# Patient Record
Sex: Female | Born: 1995 | Race: White | Hispanic: No | Marital: Single | Smoking: Never smoker
Health system: Southern US, Community
[De-identification: ages and names within clinical notes are randomized; demographics above are authoritative.]

## PROBLEM LIST (undated history)

## (undated) DIAGNOSIS — Z7689 Persons encountering health services in other specified circumstances: Secondary | ICD-10-CM

## (undated) DIAGNOSIS — E669 Obesity, unspecified: Secondary | ICD-10-CM

## (undated) DIAGNOSIS — N920 Excessive and frequent menstruation with regular cycle: Secondary | ICD-10-CM

## (undated) DIAGNOSIS — R87629 Unspecified abnormal cytological findings in specimens from vagina: Secondary | ICD-10-CM

## (undated) DIAGNOSIS — N946 Dysmenorrhea, unspecified: Secondary | ICD-10-CM

## (undated) DIAGNOSIS — F419 Anxiety disorder, unspecified: Secondary | ICD-10-CM

## (undated) DIAGNOSIS — I10 Essential (primary) hypertension: Secondary | ICD-10-CM

## (undated) HISTORY — DX: Persons encountering health services in other specified circumstances: Z76.89

## (undated) HISTORY — DX: Excessive and frequent menstruation with regular cycle: N92.0

## (undated) HISTORY — DX: Dysmenorrhea, unspecified: N94.6

## (undated) HISTORY — DX: Unspecified abnormal cytological findings in specimens from vagina: R87.629

## (undated) HISTORY — DX: Essential (primary) hypertension: I10

## (undated) HISTORY — DX: Obesity, unspecified: E66.9

## (undated) HISTORY — PX: WISDOM TOOTH EXTRACTION: SHX21

## (undated) HISTORY — DX: Anxiety disorder, unspecified: F41.9

---
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
## 2014-03-07 ENCOUNTER — Ambulatory Visit: Payer: BC Managed Care – PPO | Admitting: Adult Health

## 2014-03-07 ENCOUNTER — Encounter: Payer: Self-pay | Admitting: *Deleted

## 2015-04-06 ENCOUNTER — Ambulatory Visit (INDEPENDENT_AMBULATORY_CARE_PROVIDER_SITE_OTHER): Payer: BLUE CROSS/BLUE SHIELD | Admitting: Adult Health

## 2015-04-06 ENCOUNTER — Encounter: Payer: Self-pay | Admitting: Adult Health

## 2015-04-06 VITALS — BP 140/80 | HR 72 | Ht 64.0 in | Wt 275.6 lb

## 2015-04-06 DIAGNOSIS — N921 Excessive and frequent menstruation with irregular cycle: Secondary | ICD-10-CM | POA: Diagnosis not present

## 2015-04-06 DIAGNOSIS — N946 Dysmenorrhea, unspecified: Secondary | ICD-10-CM | POA: Diagnosis not present

## 2015-04-06 DIAGNOSIS — Z3202 Encounter for pregnancy test, result negative: Secondary | ICD-10-CM | POA: Diagnosis not present

## 2015-04-06 MED ORDER — NORETHIN-ETH ESTRAD-FE BIPHAS 1 MG-10 MCG / 10 MCG PO TABS: 1.0000 | ORAL_TABLET | Freq: Every day | ORAL | Status: DC

## 2015-04-06 NOTE — Patient Instructions (Signed)
Start lo loestrin today take 1 daily Follow up in 3 months

## 2015-04-06 NOTE — Progress Notes (Signed)
Subjective:     Patient ID: Melanie Solis, female   DOB: 1996/08/07, 19 y.o.   MRN: [id number]  HPI [name] is a 19 year old white female in complaining of missing 2 periods and before that periods heavy with cramps, was seen February 2015 and placed on Junel 1-20 and stopped due to nausea and vomiting and diarrhea, told her probably not the pill.  Review of Systems  Patient denies any headaches, hearing loss, fatigue, blurred vision, shortness of breath, chest pain, abdominal pain, problems with bowel movements, urination, or intercourse(never had sex). No joint pain or mood swings.has missed 2 periods, has heavy periods and cramps   Reviewed past medical,surgical, social and family history. Reviewed medications and allergies.     Objective:   Physical Exam BP 140/80 mmHg  Pulse 72  Ht 5\' 4"  (1.626 m)  Wt 275 lb 9.6 oz (125.011 kg)  BMI 47.28 kg/m2  LMP 01/10/2015  UPT negative, Skin warm and dry. Neck: mid line trachea, normal thyroid, good ROM, no lymphadenopathy noted. Lungs: clear to ausculation bilaterally. Cardiovascular: regular rate and rhythm.Discussed trying OCs again, take with food or at night and take every day, if has problems call, not stop taking.    Assessment:    Missed period Menorrhagia with irregular cycle Dysmenorrhea     Plan:     Rx lo loestrin disp 1 pack take 1 daily with 11 refills, start today Follow up in 3 months If has sex use condoms

## 2015-04-10 LAB — POCT URINE PREGNANCY: Preg Test, Ur: NEGATIVE

## 2015-07-11 ENCOUNTER — Encounter: Payer: Self-pay | Admitting: Adult Health

## 2015-07-11 ENCOUNTER — Ambulatory Visit (INDEPENDENT_AMBULATORY_CARE_PROVIDER_SITE_OTHER): Payer: BLUE CROSS/BLUE SHIELD | Admitting: Adult Health

## 2015-07-11 VITALS — BP 128/70 | HR 72 | Ht 65.0 in | Wt 271.0 lb

## 2015-07-11 DIAGNOSIS — Z308 Encounter for other contraceptive management: Secondary | ICD-10-CM | POA: Diagnosis not present

## 2015-07-11 DIAGNOSIS — Z7689 Persons encountering health services in other specified circumstances: Secondary | ICD-10-CM

## 2015-07-11 MED ORDER — NORETHIN-ETH ESTRAD-FE BIPHAS 1 MG-10 MCG / 10 MCG PO TABS: 1.0000 | ORAL_TABLET | Freq: Every day | ORAL | Status: DC

## 2015-07-11 NOTE — Progress Notes (Signed)
Subjective:     Patient ID: Melanie Solis, female   DOB: 13-Jan-1996, 19 y.o.   MRN: [id number]  HPI Melanie Solis is a 19 year old white female back for 3 month follow up of starting lo loestrin for menorrhagia and dysmenorrhea and is doing great, no more cramps and periods are about 2 days and lighter.  Review of Systems Patient denies any headaches, hearing loss, fatigue, blurred vision, shortness of breath, chest pain, abdominal pain, problems with bowel movements, urination, or intercourse(not having sex). No joint pain or mood swings.See HPI. Reviewed past medical,surgical, social and family history. Reviewed medications and allergies.     Objective:   Physical Exam BP 128/70 mmHg  Pulse 72  Ht  (1.651 m)  Wt 271 lb (122.925 kg)  BMI 45.10 kg/m2  LMP 06/02/2015 Skin warm and dry. Lungs: clear to ausculation bilaterally. Cardiovascular: regular rate and rhythm.Has lost about 5 lbs.    Assessment:     Period management    Plan:     Refilled lo loestrin x 1 year Return in 1 year for pap and physical,or before if needed

## 2015-07-11 NOTE — Patient Instructions (Signed)
Pap and physical in 1 year  

## 2015-12-14 ENCOUNTER — Ambulatory Visit (INDEPENDENT_AMBULATORY_CARE_PROVIDER_SITE_OTHER): Payer: BLUE CROSS/BLUE SHIELD | Admitting: Adult Health

## 2015-12-14 ENCOUNTER — Encounter: Payer: Self-pay | Admitting: Adult Health

## 2015-12-14 VITALS — BP 118/62 | HR 62 | Ht 65.0 in | Wt 267.0 lb

## 2015-12-14 DIAGNOSIS — Z7689 Persons encountering health services in other specified circumstances: Secondary | ICD-10-CM

## 2015-12-14 DIAGNOSIS — Z308 Encounter for other contraceptive management: Secondary | ICD-10-CM

## 2015-12-14 MED ORDER — NORETHIN-ETH ESTRAD-FE BIPHAS 1 MG-10 MCG / 10 MCG PO TABS: 1.0000 | ORAL_TABLET | Freq: Every day | ORAL | Status: DC

## 2015-12-14 NOTE — Patient Instructions (Signed)
Continue lo loestrin  go to DSS to check on family planning medicaid  Return in 4 months for pap and physical

## 2015-12-14 NOTE — Progress Notes (Signed)
Subjective:     Patient ID: Melanie Solis, female   DOB: 1996-03-03, 19 y.o.   MRN: [id number]  HPI Antoinetta is a 20 year old white female in to refill lo loesrtin, she is very happy with them , periods are light,short and regular and no cramps like before, Her Dad loses his job in June and she wants pap before then.  Review of Systems Patient denies any headaches, hearing loss, fatigue, blurred vision, shortness of breath, chest pain, abdominal pain, problems with bowel movements, urination, or intercourse(not having sex). No joint pain or mood swings. Reviewed past medical,surgical, social and family history. Reviewed medications and allergies.     Objective:   Physical Exam BP 118/62 mmHg  Pulse 62  Ht  (1.651 m)  Wt 267 lb (121.11 kg)  BMI 44.43 kg/m2  LMP 12/13/2015 Skin warm and dry. Neck: mid line trachea, normal thyroid, good ROM, no lymphadenopathy noted. Lungs: clear to ausculation bilaterally. Cardiovascular: regular rate and rhythm.She is very happy with lo loestrin and wants to continue.  Discussed could try to get family planning medicaid, which covers, pap and physical and birth control and she could then wait til 20 to get pap. Face time 10 minutes with 50 % counseling.     Assessment:     Period management     Plan:     Refilled lo loestrin take 1 daily disp 1 pack with 11 refills Return in 4 months for pap and physical  Go to DSS to se if can get family planning medicaid

## 2016-04-18 ENCOUNTER — Encounter: Payer: Self-pay | Admitting: Adult Health

## 2016-04-18 ENCOUNTER — Other Ambulatory Visit (HOSPITAL_COMMUNITY)
Admission: RE | Admit: 2016-04-18 | Discharge: 2016-04-18 | Disposition: A | Discharge status: Home / Self Care | Payer: BLUE CROSS/BLUE SHIELD | Source: Ambulatory Visit | Attending: Obstetrics & Gynecology | Admitting: Obstetrics & Gynecology

## 2016-04-18 ENCOUNTER — Ambulatory Visit (INDEPENDENT_AMBULATORY_CARE_PROVIDER_SITE_OTHER): Payer: BLUE CROSS/BLUE SHIELD | Admitting: Adult Health

## 2016-04-18 ENCOUNTER — Telehealth: Payer: Self-pay | Admitting: Adult Health

## 2016-04-18 VITALS — BP 132/76 | HR 72 | Ht 64.0 in | Wt 274.0 lb

## 2016-04-18 DIAGNOSIS — Z01419 Encounter for gynecological examination (general) (routine) without abnormal findings: Secondary | ICD-10-CM

## 2016-04-18 NOTE — Telephone Encounter (Signed)
Left message that insurance covers nexplanon 100% per Amy, so call for appt next week

## 2016-04-18 NOTE — Patient Instructions (Signed)
Physical in 1 year, pap in 3 inf normal Continue lo loestrin Will call about nexplanon coverage

## 2016-04-18 NOTE — Progress Notes (Signed)
Patient ID: Melanie CanningChrissy M Solis, female   DOB: 10/12/1996, 20 y.o.   MRN: [id number] History of Present Illness: Melanie DockerChrissy is a 20 year old white female in for a well woman gyn exam and pap.She loses her insurance 6/30.She is happy with lo loestrin, has no periods,but is interested in nexplanon. PCP is Dr Phillips OdorGolding.   Current Medications, Allergies, Past Medical History, Past Surgical History, Family History and Social History were reviewed in [facility] electronic medical record.     Review of Systems: Patient denies any headaches, hearing loss, fatigue, blurred vision, shortness of breath, chest pain, abdominal pain, problems with bowel movements, urination, or intercourse(not having sex). No joint pain or mood swings.    Physical Exam:BP 132/76 mmHg  Pulse 72  Ht 5\' 4"  (1.626 m)  Wt 274 lb (124.286 kg)  BMI 47.01 kg/m2 General:  Well developed, well nourished, no acute distress Skin:  Warm and dry Neck:  Midline trachea, normal thyroid, good ROM, no lymphadenopathy Lungs; Clear to auscultation bilaterally Breast:  No dominant palpable mass, retraction, or nipple discharge Cardiovascular: Regular rate and rhythm Abdomen:  Soft, non tender, no hepatosplenomegaly Pelvic:  External genitalia is normal in appearance, no lesions.  The vagina is normal in appearance. Urethra has no lesions or masses. The cervix is nulliparous, pap performed.Marland Kitchen.  Uterus is felt to be normal size, shape, and contour.  No adnexal masses or tenderness noted.Bladder is non tender, no masses felt. Extremities/musculoskeletal:  No swelling or varicosities noted, no clubbing or cyanosis Psych:  No mood changes, alert and cooperative,seems happy   Impression: Well woman gyn exam and pap   Plan: Physical in 1 year, pap in 3 if normal Continue lo loestrin for now, will check insurance for nexplanon coverage

## 2016-04-22 LAB — CYTOLOGY - PAP

## 2016-04-23 ENCOUNTER — Encounter: Payer: BLUE CROSS/BLUE SHIELD | Admitting: Adult Health

## 2016-04-23 ENCOUNTER — Other Ambulatory Visit: Payer: BLUE CROSS/BLUE SHIELD

## 2016-04-23 DIAGNOSIS — Z3049 Encounter for surveillance of other contraceptives: Secondary | ICD-10-CM

## 2016-04-23 LAB — BETA HCG QUANT (REF LAB): hCG Quant: <1 m[IU]/mL

## 2016-05-02 ENCOUNTER — Encounter: Payer: BLUE CROSS/BLUE SHIELD | Admitting: Adult Health

## 2016-05-02 ENCOUNTER — Other Ambulatory Visit: Payer: BLUE CROSS/BLUE SHIELD

## 2016-05-02 ENCOUNTER — Encounter: Payer: Self-pay | Admitting: Adult Health

## 2016-11-07 ENCOUNTER — Other Ambulatory Visit: Payer: Self-pay | Admitting: Adult Health

## 2017-02-12 DIAGNOSIS — R05 Cough: Secondary | ICD-10-CM | POA: Diagnosis not present

## 2017-02-12 DIAGNOSIS — Z6841 Body Mass Index (BMI) 40.0 and over, adult: Secondary | ICD-10-CM | POA: Diagnosis not present

## 2017-02-12 DIAGNOSIS — Z1389 Encounter for screening for other disorder: Secondary | ICD-10-CM | POA: Diagnosis not present

## 2017-02-12 DIAGNOSIS — H6693 Otitis media, unspecified, bilateral: Secondary | ICD-10-CM | POA: Diagnosis not present

## 2017-02-12 DIAGNOSIS — J329 Chronic sinusitis, unspecified: Secondary | ICD-10-CM | POA: Diagnosis not present

## 2017-05-30 DIAGNOSIS — R109 Unspecified abdominal pain: Secondary | ICD-10-CM | POA: Diagnosis not present

## 2017-05-30 DIAGNOSIS — E748 Other specified disorders of carbohydrate metabolism: Secondary | ICD-10-CM | POA: Diagnosis not present

## 2017-05-30 DIAGNOSIS — Z6841 Body Mass Index (BMI) 40.0 and over, adult: Secondary | ICD-10-CM | POA: Diagnosis not present

## 2017-05-30 DIAGNOSIS — Z1389 Encounter for screening for other disorder: Secondary | ICD-10-CM | POA: Diagnosis not present

## 2017-07-24 DIAGNOSIS — Z23 Encounter for immunization: Secondary | ICD-10-CM | POA: Diagnosis not present

## 2019-10-05 ENCOUNTER — Telehealth: Payer: Self-pay | Admitting: Advanced Practice Midwife

## 2019-10-05 NOTE — Telephone Encounter (Signed)

## 2019-10-06 ENCOUNTER — Encounter: Payer: Self-pay | Admitting: Advanced Practice Midwife

## 2019-10-06 ENCOUNTER — Ambulatory Visit (INDEPENDENT_AMBULATORY_CARE_PROVIDER_SITE_OTHER): Payer: BC Managed Care – PPO | Admitting: Advanced Practice Midwife

## 2019-10-06 ENCOUNTER — Other Ambulatory Visit: Payer: Self-pay

## 2019-10-06 DIAGNOSIS — Z6841 Body Mass Index (BMI) 40.0 and over, adult: Secondary | ICD-10-CM | POA: Insufficient documentation

## 2019-10-06 DIAGNOSIS — Z3046 Encounter for surveillance of implantable subdermal contraceptive: Secondary | ICD-10-CM | POA: Diagnosis not present

## 2019-10-06 DIAGNOSIS — Z30011 Encounter for initial prescription of contraceptive pills: Secondary | ICD-10-CM

## 2019-10-06 MED ORDER — NORETHIN ACE-ETH ESTRAD-FE 1-20 MG-MCG PO TABS: 1.0000 | ORAL_TABLET | Freq: Every day | ORAL | 4 refills | Status: DC

## 2019-10-06 NOTE — Progress Notes (Signed)
   [facility] REMOVAL Patient name: LATIANA TOMEI MRN [id number]  Date of birth: 1996/04/20 Subjective Findings:   Mehreen TYNIKA LUDDY is a 23 y.o. G0P0000 Caucasian female being seen today for removal of a Nexplanon. Her Nexplanon was placed in Feb 2018 at [facility]. She desires removal because 1) weight gain despite exercise and eating well 2) mood swings 3) pain in L upper arm after lifting that persists a day or two later. Signed copy of informed consent in chart. She is not currently sexually active. Unable to urinate.  No LMP recorded. Patient has had an implant. Last pap 03/2016. Results were:  normal The planned method of family planning is OCP (estrogen/progesterone) Pertinent History Reviewed:   Reviewed past medical,surgical, social, obstetrical and family history.  Reviewed problem list, medications and allergies. Objective Findings & Procedure:    Vitals:   10/06/19 0913  BP: 136/84  Pulse: 78  Weight: (!) 308 lb (139.7 kg)  Height: 5\' 5"  (1.651 m)  Body mass index is 51.25 kg/m.  No results found for this or any previous visit (from the past 24 hour(s)).   Time out was performed.  Nexplanon site identified.  Area prepped in usual sterile fashon. One cc of 2% lidocaine was used to anesthetize the area at the distal end of the implant. A small stab incision was made right beside the implant on the distal portion.  The Nexplanon rod was grasped using hemostats and removed without difficulty.  There was less than 3 cc blood loss. There were no complications.  Steri-strips were applied over the small incision and a pressure bandage was applied.  The patient tolerated the procedure well. Assessment & Plan:   1) Nexplanon removal She was instructed to keep the area clean and dry, remove pressure bandage in 24 hours, and keep insertion site covered with the steri-strip for 3-5 days.   Follow-up PRN problems.  2) Rx for Junel 1/20 to start tomorrow; will have f/u appt in  3 mos with Anderson Malta  No orders of the defined types were placed in this encounter.   Follow-up: Return for 3 mos, Pap & Physical, contraception f/u.  Myrtis Ser CNM 10/06/2019 10:42 AM

## 2020-01-04 ENCOUNTER — Other Ambulatory Visit (HOSPITAL_COMMUNITY)
Admission: RE | Admit: 2020-01-04 | Discharge: 2020-01-04 | Disposition: A | Discharge status: Home / Self Care | Payer: BC Managed Care – PPO | Source: Ambulatory Visit | Attending: Adult Health | Admitting: Adult Health

## 2020-01-04 ENCOUNTER — Other Ambulatory Visit: Payer: Self-pay

## 2020-01-04 ENCOUNTER — Ambulatory Visit (INDEPENDENT_AMBULATORY_CARE_PROVIDER_SITE_OTHER): Payer: BC Managed Care – PPO | Admitting: Adult Health

## 2020-01-04 ENCOUNTER — Encounter: Payer: Self-pay | Admitting: Adult Health

## 2020-01-04 VITALS — BP 145/83 | HR 80 | Ht 65.0 in | Wt 305.0 lb

## 2020-01-04 DIAGNOSIS — Z113 Encounter for screening for infections with a predominantly sexual mode of transmission: Secondary | ICD-10-CM | POA: Diagnosis not present

## 2020-01-04 DIAGNOSIS — Z01419 Encounter for gynecological examination (general) (routine) without abnormal findings: Secondary | ICD-10-CM | POA: Diagnosis not present

## 2020-01-04 DIAGNOSIS — Z124 Encounter for screening for malignant neoplasm of cervix: Secondary | ICD-10-CM

## 2020-01-04 DIAGNOSIS — Z3041 Encounter for surveillance of contraceptive pills: Secondary | ICD-10-CM

## 2020-01-04 DIAGNOSIS — Z3009 Encounter for other general counseling and advice on contraception: Secondary | ICD-10-CM

## 2020-01-04 DIAGNOSIS — F419 Anxiety disorder, unspecified: Secondary | ICD-10-CM | POA: Insufficient documentation

## 2020-01-04 MED ORDER — NORETHIN ACE-ETH ESTRAD-FE 1-20 MG-MCG PO TABS: 1.0000 | ORAL_TABLET | Freq: Every day | ORAL | 12 refills | Status: DC

## 2020-01-04 MED ORDER — SERTRALINE HCL 50 MG PO TABS: 50.0000 mg | ORAL_TABLET | Freq: Every day | ORAL | 3 refills | Status: DC

## 2020-01-04 NOTE — Patient Instructions (Signed)

## 2020-01-04 NOTE — Progress Notes (Signed)
Patient ID: Melanie Solis, female   DOB: 1995-11-19, 24 y.o.   MRN: [id number] History of Present Illness: Melanie Solis is a 24 year old white female,single, G0P0, in for a well woman gyn exam and pap.She had nexplanon removed in December and is on junel 1/20, had period in January, none since. Having anxiety. PCP is Dr Phillips Odor.    Current Medications, Allergies, Past Medical History, Past Surgical History, Family History and Social History were reviewed in [facility] record.     Review of Systems: Patient denies any headaches, hearing loss, fatigue, blurred vision, shortness of breath, chest pain, abdominal pain, problems with bowel movements, urination, or intercourse(not currently active). No joint pain or mood swings. +anxiety, almost panic attack, had to pull over the other day when driving and take a few deep breaths   Physical Exam:BP (!) 145/83 (BP Location: Left Arm, Patient Position: Sitting, Cuff Size: Large)   Pulse 80   Ht 5\' 5"  (1.651 m)   Wt (!) 305 lb (138.3 kg)   BMI 50.75 kg/m has lost 3 lbs since having nexplanon removed General:  Well developed, well nourished, no acute distress Skin:  Warm and dry Neck:  Midline trachea, normal thyroid, good ROM, no lymphadenopathy Lungs; Clear to auscultation bilaterally Breast:  No dominant palpable mass, retraction, or nipple discharge Cardiovascular: Regular rate and rhythm Abdomen:  Soft, non tender, no hepatosplenomegaly Pelvic:  External genitalia is normal in appearance, no lesions.  The vagina is normal in appearance. Urethra has no lesions or masses. The cervix is nulliparous, pap, with GC/CHL and HPV reflex.  Uterus is felt to be normal size, shape, and contour.  No adnexal masses or tenderness noted.Bladder is non tender, no masses felt. Extremities/musculoskeletal:  No swelling or varicosities noted, no clubbing or cyanosis Psych:  No mood changes, alert and cooperative,seems happy Fall is low PHQ 2  score is 0 Co exam with NP student.  Impression and Plan: 1. Routine cervical smear Pap sent   2. Encounter for gynecological examination with Papanicolaou smear of cervix Pap sent Physical in 1 year Pap in 3 if normal   3. Family planning Check HIV and RPR  4. Screening examination for STD (sexually transmitted disease) Check HIV and RPR  5. Anxiety Discussed medication and she wants to try something, will Rx Zoloft, she is aware can take several weeks to see a difference, and let me know in about 8-12 weeks how she is doing and will refill Zoloft or change dose if needed  Review handout on managing anxiety Take time for self Take deep breaths  Meds ordered this encounter  Medications  . norethindrone-ethinyl estradiol (JUNEL FE 1/20) 1-20 MG-MCG tablet    Sig: Take 1 tablet by mouth daily.    Dispense:  1 Package    Refill:  12    Order Specific Question:   Supervising Provider    Answer:   2/20, LUTHER H [[id number]]  . sertraline (ZOLOFT) 50 MG tablet    Sig: Take 1 tablet (50 mg total) by mouth daily.    Dispense:  30 tablet    Refill:  3    Order Specific Question:   Supervising Provider    Answer:   Despina Hidden, LUTHER H [[id number]]    6. Encounter for surveillance of contraceptive pills Continue junel 1/20 refills sent

## 2020-01-05 LAB — CYTOLOGY - PAP
Chlamydia: NEGATIVE
Comment: NEGATIVE
Comment: NORMAL
Diagnosis: NEGATIVE
Neisseria Gonorrhea: NEGATIVE

## 2020-01-05 LAB — HIV ANTIBODY (ROUTINE TESTING W REFLEX): HIV Screen 4th Generation wRfx: NONREACTIVE

## 2020-01-05 LAB — RPR: RPR Ser Ql: NONREACTIVE

## 2020-02-22 ENCOUNTER — Other Ambulatory Visit: Payer: Self-pay | Admitting: Adult Health

## 2020-02-22 MED ORDER — BUSPIRONE HCL 5 MG PO TABS: 5.0000 mg | ORAL_TABLET | Freq: Three times a day (TID) | ORAL | 1 refills | Status: DC

## 2020-02-22 NOTE — Progress Notes (Signed)
Will add buspar

## 2020-04-13 ENCOUNTER — Other Ambulatory Visit: Payer: Self-pay | Admitting: Adult Health

## 2020-05-10 ENCOUNTER — Other Ambulatory Visit: Payer: Self-pay | Admitting: Adult Health

## 2020-05-10 MED ORDER — NORETHIN ACE-ETH ESTRAD-FE 1-20 MG-MCG PO TABS: 1.0000 | ORAL_TABLET | Freq: Every day | ORAL | 4 refills | Status: DC

## 2020-05-10 NOTE — Progress Notes (Signed)
Refilled junel  

## 2020-08-10 DIAGNOSIS — Z23 Encounter for immunization: Secondary | ICD-10-CM | POA: Diagnosis not present

## 2020-10-04 ENCOUNTER — Other Ambulatory Visit: Payer: Self-pay | Admitting: Adult Health

## 2020-10-04 MED ORDER — NORGESTIMATE-ETH ESTRADIOL 0.25-35 MG-MCG PO TABS: 1.0000 | ORAL_TABLET | Freq: Every day | ORAL | 11 refills | Status: DC

## 2020-10-04 NOTE — Progress Notes (Signed)
Will try sprintec to stop bleeding

## 2021-02-13 ENCOUNTER — Other Ambulatory Visit: Payer: Self-pay | Admitting: Adult Health

## 2021-03-21 ENCOUNTER — Other Ambulatory Visit: Payer: Self-pay | Admitting: Adult Health

## 2021-04-27 ENCOUNTER — Other Ambulatory Visit: Payer: Self-pay | Admitting: Adult Health

## 2021-11-16 ENCOUNTER — Other Ambulatory Visit: Payer: Self-pay | Admitting: Adult Health

## 2022-02-19 ENCOUNTER — Ambulatory Visit (INDEPENDENT_AMBULATORY_CARE_PROVIDER_SITE_OTHER): Payer: BC Managed Care – PPO

## 2022-02-19 ENCOUNTER — Encounter: Payer: Self-pay | Admitting: Emergency Medicine

## 2022-02-19 ENCOUNTER — Ambulatory Visit
Admission: EM | Admit: 2022-02-19 | Discharge: 2022-02-19 | Disposition: A | Discharge status: Home / Self Care | Payer: BC Managed Care – PPO

## 2022-02-19 DIAGNOSIS — S8001XA Contusion of right knee, initial encounter: Secondary | ICD-10-CM

## 2022-02-19 DIAGNOSIS — M25561 Pain in right knee: Secondary | ICD-10-CM

## 2022-02-19 MED ORDER — IBUPROFEN 800 MG PO TABS: 800.0000 mg | ORAL_TABLET | Freq: Three times a day (TID) | ORAL | 0 refills | Status: DC | PRN

## 2022-02-19 NOTE — ED Provider Notes (Addendum)
?[facility] ? ? ? ?CSN: [id number] ?Arrival date & time: 02/19/22  1302 ? ? ?  ? ?History   ?Chief Complaint ?Chief Complaint  ?Patient presents with  ? Knee Injury  ?  I fell and hit the same knee twice and it?s causing pain - Entered by patient  ? ? ?HPI ?Melanie Solis is a 26 y.o. female.  ? ?The patient is a 26 year old female who presents with right knee pain.  Patient states she fell on her knee at work at the [employer] after she slipped in dog urine and fell onto a concrete floor directly onto the right knee.  She states 1 day ago, she was going up the steps into her house and slipped on the steps and fell onto the knee again.  She states the pain has worsened significantly since she fell the second time.  He states that sitting in the flexed position or extended position and long periods of time causes shooting pains throughout her knee.  Patient has tried ice, heat, and has taken Tylenol for her symptoms.  She denies any prior injury to the knee. ? ?The history is provided by the patient.  ? ?Past Medical History:  ?Diagnosis Date  ? Dysmenorrhea 12/06/2013  ? Menorrhagia 12/06/2013  ? Menstrual extraction 12/06/2013  ? Will try OCs  ? Obesity   ? ? ?Patient Active Problem List  ? Diagnosis Date Noted  ? Anxiety 01/04/2020  ? Screening examination for STD (sexually transmitted disease) 01/04/2020  ? Family planning 01/04/2020  ? Routine cervical smear 01/04/2020  ? Encounter for surveillance of contraceptive pills 01/04/2020  ? Morbid obesity with BMI of 50.0-59.9, adult (HCC) 10/06/2019  ? OCP (oral contraceptive pills) initiation 10/06/2019  ? Menstrual extraction 12/06/2013  ? Menorrhagia 12/06/2013  ? Dysmenorrhea 12/06/2013  ? ? ?Past Surgical History:  ?Procedure Laterality Date  ? WISDOM TOOTH EXTRACTION    ? ? ?OB History   ? ? Gravida  ?0  ? Para  ?0  ? Term  ?0  ? Preterm  ?0  ? AB  ?0  ? Living  ?0  ?  ? ? SAB  ?0  ? IAB  ?0  ? Ectopic  ?0  ? Multiple  ?0  ? Live Births  ?   ?   ?  ?   ? ? ? ?Home Medications   ? ?Prior to Admission medications   ?Medication Sig Start Date End Date Taking? Authorizing Provider  ?escitalopram (LEXAPRO) 20 MG tablet Take 20 mg by mouth daily.   Yes [provider]  ?ibuprofen (ADVIL) 800 MG tablet Take 1 tablet (800 mg total) by mouth every 8 (eight) hours as needed. 02/19/22  Yes Amiir Heckard-Warren, Sadie Haberhristie J, NP  ?busPIRone (BUSPAR) 5 MG tablet TAKE (1) TABLET BY MOUTH (3) TIMES DAILY. 03/21/21   Adline PotterGriffin, Jennifer A, NP  ?sertraline (ZOLOFT) 50 MG tablet TAKE ONE TABLET BY MOUTH ONCE DAILY. 05/01/21   Adline PotterGriffin, Jennifer A, NP  ?SPRINTEC 28 0.25-35 MG-MCG tablet TAKE ONE TABLET BY MOUTH ONCE DAILY. 11/19/21   Adline PotterGriffin, Jennifer A, NP  ? ? ?Family History ?Family History  ?Problem Relation Age of Onset  ? Cancer Mother   ?     skin  ? ? ?Social History ?Social History  ? ?Tobacco Use  ? Smoking status: Never  ? Smokeless tobacco: Never  ?Vaping Use  ? Vaping Use: Never used  ?Substance Use Topics  ? Alcohol use: No  ?  Drug use: No  ? ? ? ?Allergies   ?Patient has no known allergies. ? ? ?Review of Systems ?Review of Systems  ?Constitutional: Negative.   ?Musculoskeletal:  Positive for gait problem and joint swelling (right knee).  ?     Right knee pain  ?Skin: Negative.   ?Psychiatric/Behavioral: Negative.    ? ? ?Physical Exam ?Triage Vital Signs ?ED Triage Vitals  ?Enc Vitals Group  ?   BP 02/19/22 1510 (!) 172/100  ?   Pulse Rate 02/19/22 1510 79  ?   Resp 02/19/22 1510 18  ?   Temp 02/19/22 1510 98 ?F (36.7 ?C)  ?   Temp Source 02/19/22 1510 Oral  ?   SpO2 02/19/22 1510 99 %  ?   Weight --   ?   Height --   ?   Head Circumference --   ?   Peak Flow --   ?   Pain Score 02/19/22 1511 8  ?   Pain Loc --   ?   Pain Edu? --   ?   Excl. in GC? --   ? ?No data found. ? ?Updated Vital Signs ?BP (!) 172/100 (BP Location: Right Arm)   Pulse 79   Temp 98 ?F (36.7 ?C) (Oral)   Resp 18   LMP 02/16/2022 (Exact Date)   SpO2 99%  ? ?Visual Acuity ?Right Eye Distance:   ?Left  Eye Distance:   ?Bilateral Distance:   ? ?Right Eye Near:   ?Left Eye Near:    ?Bilateral Near:    ? ?Physical Exam ?Vitals reviewed.  ?Constitutional:   ?   Appearance: Normal appearance.  ?Eyes:  ?   Extraocular Movements: Extraocular movements intact.  ?   Pupils: Pupils are equal, round, and reactive to light.  ?Pulmonary:  ?   Effort: Pulmonary effort is normal.  ?Musculoskeletal:  ?   Cervical back: Normal range of motion.  ?   Right knee: Decreased range of motion. Tenderness present over the medial joint line, lateral joint line, MCL, LCL and patellar tendon.  ?   Instability Tests: Anterior drawer test negative. Posterior drawer test negative.  ?   Left knee: Normal.  ?Skin: ?   General: Skin is warm and dry.  ?Neurological:  ?   Mental Status: She is alert.  ? ? ? ?UC Treatments / Results  ?Labs ?(all labs ordered are listed, but only abnormal results are displayed) ?Labs Reviewed - No data to display ? ?EKG ? ? ?Radiology ?DG Knee Complete 4 Views Right ? ?Result Date: 02/19/2022 ?CLINICAL DATA:  Status post fall onto concrete.  Pain to knee. EXAM: RIGHT KNEE - COMPLETE 4+ VIEW COMPARISON:  None. FINDINGS: No joint effusion. No acute fracture or dislocation. No significant arthropathy. Soft tissues are unremarkable. IMPRESSION: 1. No acute findings. Electronically Signed   By: Signa Kell M.D.   On: 02/19/2022 15:27   ? ?Procedures ?Procedures (including critical care time) ? ?Medications Ordered in UC ?Medications - No data to display ? ?Initial Impression / Assessment and Plan / UC Course  ?I have reviewed the triage vital signs and the nursing notes. ? ?Pertinent labs & imaging results that were available during my care of the patient were reviewed by me and considered in my medical decision making (see chart for details). ? ?The patient is a 26 year old female who presents with pain in the right knee after 2 falls.  Most recent fall was 1 day ago.  Patient has  tenderness to the lateral and medial  aspect of the right knee, and in the patella tendon.  Patient has some swelling to the area as well.  Symptoms are consistent with a knee contusion based on her mechanism of injury.  X-rays do not show any fractures or dislocations.  RICE therapy was recommended, rest, ice, compression, and elevation.  Patient advised to take ibuprofen or Tylenol for pain, fever, or general discomfort.  Patient advised that symptoms may take 1 to 2 weeks before she notices improvement.  Patient advised that if symptoms continue to persist after that time, to follow-up with her PCP or with orthopedics. ?Final Clinical Impressions(s) / UC Diagnoses  ? ?Final diagnoses:  ?None  ? ? ? ?Discharge Instructions   ? ?  ?Your x-rays are negative for fracture or dislocation.  As discussed, based on the mechanism of injury and your falls, symptoms are consistent with a knee contusion or bruise. ?RICE therapy to help with your symptoms.  Rest, ice, compression, and elevation.  Apply ice for 20 minutes, remove for 1 hour, then repeat.  Do this as much as possible within the next 48 to 72 hours. ?May take ibuprofen for knee pain.  I have provided prescription for you.  Take medication with food and water. ?Avoid strenuous activity while symptoms persist to include excessive bending, stooping, or squatting. ?Follow-up if your symptoms do not improve.  Symptoms may take 1 to 2 weeks before you notice real improvement however, if symptoms continue to worsen or do not improve, follow-up in our clinic or with orthopedics. ? ? ? ? ?ED Prescriptions   ? ? Medication Sig Dispense Auth. Provider  ? ibuprofen (ADVIL) 800 MG tablet Take 1 tablet (800 mg total) by mouth every 8 (eight) hours as needed. 20 tablet Taylen Wendland-Warren, Sadie Haber, NP  ? ?  ? ?PDMP not reviewed this encounter. ?  ?Abran Cantor, NP ?02/19/22 1544 ? ?  ?Abran Cantor, NP ?02/23/22 1155 ? ?

## 2022-02-19 NOTE — ED Triage Notes (Signed)
Fell on right knee on concrete 1 week ago.  Larey Seat again going up steps on right knee yesterday. ?

## 2022-02-19 NOTE — Discharge Instructions (Addendum)
Your x-rays are negative for fracture or dislocation.  As discussed, based on the mechanism of injury and your falls, symptoms are consistent with a knee contusion or bruise. ?RICE therapy to help with your symptoms.  Rest, ice, compression, and elevation.  Apply ice for 20 minutes, remove for 1 hour, then repeat.  Do this as much as possible within the next 48 to 72 hours. ?May take ibuprofen for knee pain.  I have provided prescription for you.  Take medication with food and water. ?Avoid strenuous activity while symptoms persist to include excessive bending, stooping, or squatting. ?Follow-up if your symptoms do not improve.  Symptoms may take 1 to 2 weeks before you notice real improvement however, if symptoms continue to worsen or do not improve, follow-up in our clinic or with orthopedics. ?

## 2022-08-06 ENCOUNTER — Encounter: Payer: Self-pay | Admitting: Emergency Medicine

## 2022-08-06 ENCOUNTER — Ambulatory Visit
Admission: EM | Admit: 2022-08-06 | Discharge: 2022-08-06 | Disposition: A | Discharge status: Home / Self Care | Payer: BC Managed Care – PPO | Attending: Nurse Practitioner | Admitting: Nurse Practitioner

## 2022-08-06 DIAGNOSIS — J019 Acute sinusitis, unspecified: Secondary | ICD-10-CM

## 2022-08-06 MED ORDER — AMOXICILLIN-POT CLAVULANATE 875-125 MG PO TABS: 1.0000 | ORAL_TABLET | Freq: Two times a day (BID) | ORAL | 0 refills | Status: DC

## 2022-08-06 MED ORDER — PROMETHAZINE-DM 6.25-15 MG/5ML PO SYRP: 5.0000 mL | ORAL_SOLUTION | Freq: Four times a day (QID) | ORAL | 0 refills | Status: DC | PRN

## 2022-08-06 MED ORDER — FLUTICASONE PROPIONATE 50 MCG/ACT NA SUSP: 2.0000 | Freq: Every day | NASAL | 0 refills | Status: DC

## 2022-08-06 NOTE — ED Provider Notes (Signed)
[facility]    CSN: [id number] Arrival date & time: 08/06/22  1301      History   Chief Complaint No chief complaint on file.   HPI Melanie Solis is a 26 y.o. female.   The history is provided by the patient.   Patient presents with a 3-week history of cough, nasal congestion, right ear fullness and pressure.  Patient denies fever, chills, sore throat, headache, wheezing, shortness of breath, difficulty breathing, or GI symptoms.  Patient states that symptoms started off as a "cold", but she states symptoms have progressively worsened since they started.  She states she has been taking over-the-counter Mucinex without relief.    Past Medical History:  Diagnosis Date   Dysmenorrhea 12/06/2013   Menorrhagia 12/06/2013   Menstrual extraction 12/06/2013   Will try OCs   Obesity     Patient Active Problem List   Diagnosis Date Noted   Anxiety 01/04/2020   Screening examination for STD (sexually transmitted disease) 01/04/2020   Family planning 01/04/2020   Routine cervical smear 01/04/2020   Encounter for surveillance of contraceptive pills 01/04/2020   Morbid obesity with BMI of 50.0-59.9, adult (HCC) 10/06/2019   OCP (oral contraceptive pills) initiation 10/06/2019   Menstrual extraction 12/06/2013   Menorrhagia 12/06/2013   Dysmenorrhea 12/06/2013    Past Surgical History:  Procedure Laterality Date   WISDOM TOOTH EXTRACTION      OB History     Gravida  0   Para  0   Term  0   Preterm  0   AB  0   Living  0      SAB  0   IAB  0   Ectopic  0   Multiple  0   Live Births               Home Medications    Prior to Admission medications   Medication Sig Start Date End Date Taking? Authorizing Provider  amoxicillin-clavulanate (AUGMENTIN) 875-125 MG tablet Take 1 tablet by mouth every 12 (twelve) hours. 08/06/22  Yes Rihanna Marseille-Warren, Sadie Haber, NP  fluticasone (FLONASE) 50 MCG/ACT nasal spray Place 2 sprays into both nostrils  daily. 08/06/22  Yes Brent Noto-Warren, Sadie Haber, NP  promethazine-dextromethorphan (PROMETHAZINE-DM) 6.25-15 MG/5ML syrup Take 5 mLs by mouth 4 (four) times daily as needed for cough. 08/06/22  Yes Melvine Julin-Warren, Sadie Haber, NP  busPIRone (BUSPAR) 5 MG tablet TAKE (1) TABLET BY MOUTH (3) TIMES DAILY. 03/21/21   Adline Potter, NP  escitalopram (LEXAPRO) 20 MG tablet Take 20 mg by mouth daily.    [provider]  ibuprofen (ADVIL) 800 MG tablet Take 1 tablet (800 mg total) by mouth every 8 (eight) hours as needed. 02/19/22   Janard Culp-Warren, Sadie Haber, NP  sertraline (ZOLOFT) 50 MG tablet TAKE ONE TABLET BY MOUTH ONCE DAILY. 05/01/21   Adline Potter, NP  SPRINTEC 28 0.25-35 MG-MCG tablet TAKE ONE TABLET BY MOUTH ONCE DAILY. 11/19/21   Adline Potter, NP    Family History Family History  Problem Relation Age of Onset   Cancer Mother        skin    Social History Social History   Tobacco Use   Smoking status: Never   Smokeless tobacco: Never  Vaping Use   Vaping Use: Never used  Substance Use Topics   Alcohol use: No   Drug use: No     Allergies   Patient has no known allergies.   Review of  Systems Review of Systems Per HPI  Physical Exam Triage Vital Signs ED Triage Vitals  Enc Vitals Group     BP 08/06/22 1307 (!) 154/95     Pulse Rate 08/06/22 1307 92     Resp 08/06/22 1307 16     Temp 08/06/22 1307 (!) 97.3 F (36.3 C)     Temp Source 08/06/22 1307 Oral     SpO2 08/06/22 1307 97 %     Weight --      Height --      Head Circumference --      Peak Flow --      Pain Score 08/06/22 1308 7     Pain Loc --      Pain Edu? --      Excl. in GC? --    No data found.  Updated Vital Signs BP (!) 154/95 (BP Location: Right Arm)   Pulse 92   Temp (!) 97.3 F (36.3 C) (Oral)   Resp 16   LMP 07/15/2022 (Exact Date)   SpO2 97%   Visual Acuity Right Eye Distance:   Left Eye Distance:   Bilateral Distance:    Right Eye Near:   Left Eye Near:     Bilateral Near:     Physical Exam Vitals and nursing note reviewed.  Constitutional:      Appearance: She is well-developed.  HENT:     Head: Normocephalic and atraumatic.     Right Ear: Ear canal and external ear normal.     Left Ear: Ear canal and external ear normal.     Nose: Congestion present. No rhinorrhea.     Right Turbinates: Enlarged and swollen.     Left Turbinates: Enlarged and swollen.     Right Sinus: No maxillary sinus tenderness or frontal sinus tenderness.     Left Sinus: No maxillary sinus tenderness or frontal sinus tenderness.     Mouth/Throat:     Lips: Pink.     Mouth: Mucous membranes are moist.     Pharynx: Oropharynx is clear. Uvula midline. Posterior oropharyngeal erythema present. No pharyngeal swelling, oropharyngeal exudate or uvula swelling.     Tonsils: No tonsillar exudate.  Eyes:     Conjunctiva/sclera: Conjunctivae normal.     Pupils: Pupils are equal, round, and reactive to light.  Neck:     Thyroid: No thyromegaly.     Trachea: No tracheal deviation.  Cardiovascular:     Rate and Rhythm: Normal rate and regular rhythm.     Pulses: Normal pulses.     Heart sounds: Normal heart sounds.  Pulmonary:     Effort: Pulmonary effort is normal.     Breath sounds: Normal breath sounds.  Abdominal:     General: Bowel sounds are normal. There is no distension.     Palpations: Abdomen is soft.     Tenderness: There is no abdominal tenderness.  Musculoskeletal:     Cervical back: Normal range of motion and neck supple.  Skin:    General: Skin is warm and dry.  Neurological:     Mental Status: She is alert and oriented to person, place, and time.  Psychiatric:        Behavior: Behavior normal.        Thought Content: Thought content normal.        Judgment: Judgment normal.      UC Treatments / Results  Labs (all labs ordered are listed, but only abnormal results are displayed) Labs Reviewed -  No data to display  EKG   Radiology No  results found.  Procedures Procedures (including critical care time)  Medications Ordered in UC Medications - No data to display  Initial Impression / Assessment and Plan / UC Course  I have reviewed the triage vital signs and the nursing notes.  Pertinent labs & imaging results that were available during my care of the patient were reviewed by me and considered in my medical decision making (see chart for details).  Patient presents with a 3-week history of upper respiratory symptoms.  On exam, patient's vital signs are stable, she is in no acute distress.  Lung sounds are clear throughout.  Based on the patient's presentation, and current symptoms, symptoms are consistent with acute sinusitis.  We will treat patient with Augmentin, fluticasone, and Promethazine DM for her cough.  Supportive care recommendations were also provided to the patient.  Discussed with patient that cough may continue to linger after she has completed her medication regimen.  Patient was advised of when to return if the cough continues to persist.  Patient verbalizes understanding.  All questions were answered. Final Clinical Impressions(s) / UC Diagnoses   Final diagnoses:  Acute sinusitis, recurrence not specified, unspecified location     Discharge Instructions      Take medication as directed. Increase fluids and get plenty of rest. May take over-the-counter ibuprofen or Tylenol as needed for pain, fever, or general discomfort. Recommend normal saline nasal spray to help with nasal congestion throughout the day. For your cough, it may be helpful to use a humidifier at bedtime during sleep. Follow-up in this clinic if symptoms fail to improve after this medication or with your primary care physician.     ED Prescriptions     Medication Sig Dispense Auth. Provider   amoxicillin-clavulanate (AUGMENTIN) 875-125 MG tablet Take 1 tablet by mouth every 12 (twelve) hours. 14 tablet Elvis Boot-Warren, Alda Lea,  NP   fluticasone (FLONASE) 50 MCG/ACT nasal spray Place 2 sprays into both nostrils daily. 16 g Xana Bradt-Warren, Alda Lea, NP   promethazine-dextromethorphan (PROMETHAZINE-DM) 6.25-15 MG/5ML syrup Take 5 mLs by mouth 4 (four) times daily as needed for cough. 140 mL Amiley Shishido-Warren, Alda Lea, NP      PDMP not reviewed this encounter.   Tish Men, NP 08/06/22 1347

## 2022-08-06 NOTE — ED Triage Notes (Signed)
Cough, right ear feel stopped up and hurts.  Productive cough with green sputum.  Symptoms x 3 weeks, but became worse wince Thursday.  Has been taking mucinex without relief.

## 2022-08-06 NOTE — Discharge Instructions (Signed)
Take medication as directed. Increase fluids and get plenty of rest. May take over-the-counter ibuprofen or Tylenol as needed for pain, fever, or general discomfort. Recommend normal saline nasal spray to help with nasal congestion throughout the day. For your cough, it may be helpful to use a humidifier at bedtime during sleep. Follow-up in this clinic if symptoms fail to improve after this medication or with your primary care physician.

## 2022-09-22 IMAGING — DX DG KNEE COMPLETE 4+V*R*
4 series · 4 of 4 positions shown · non-contrast
Comparison: None.

CLINICAL DATA: Status post fall onto concrete.  Pain to knee.

EXAM:
RIGHT KNEE - COMPLETE 4+ VIEW

[knee ap]
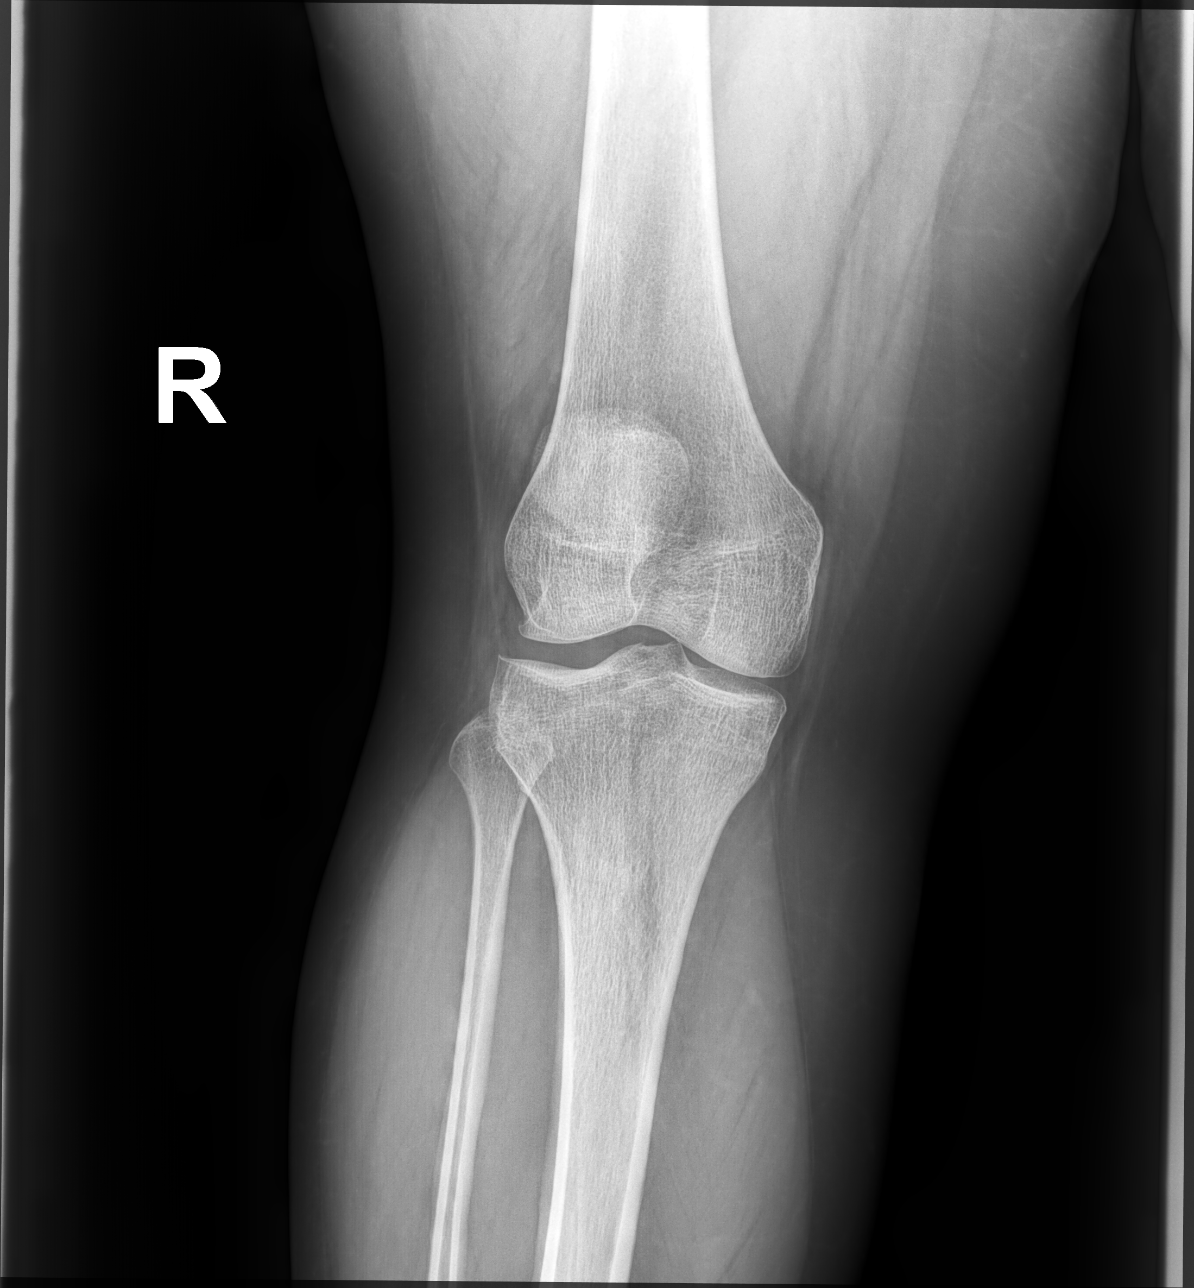

[knee mlo (1 of 2)]
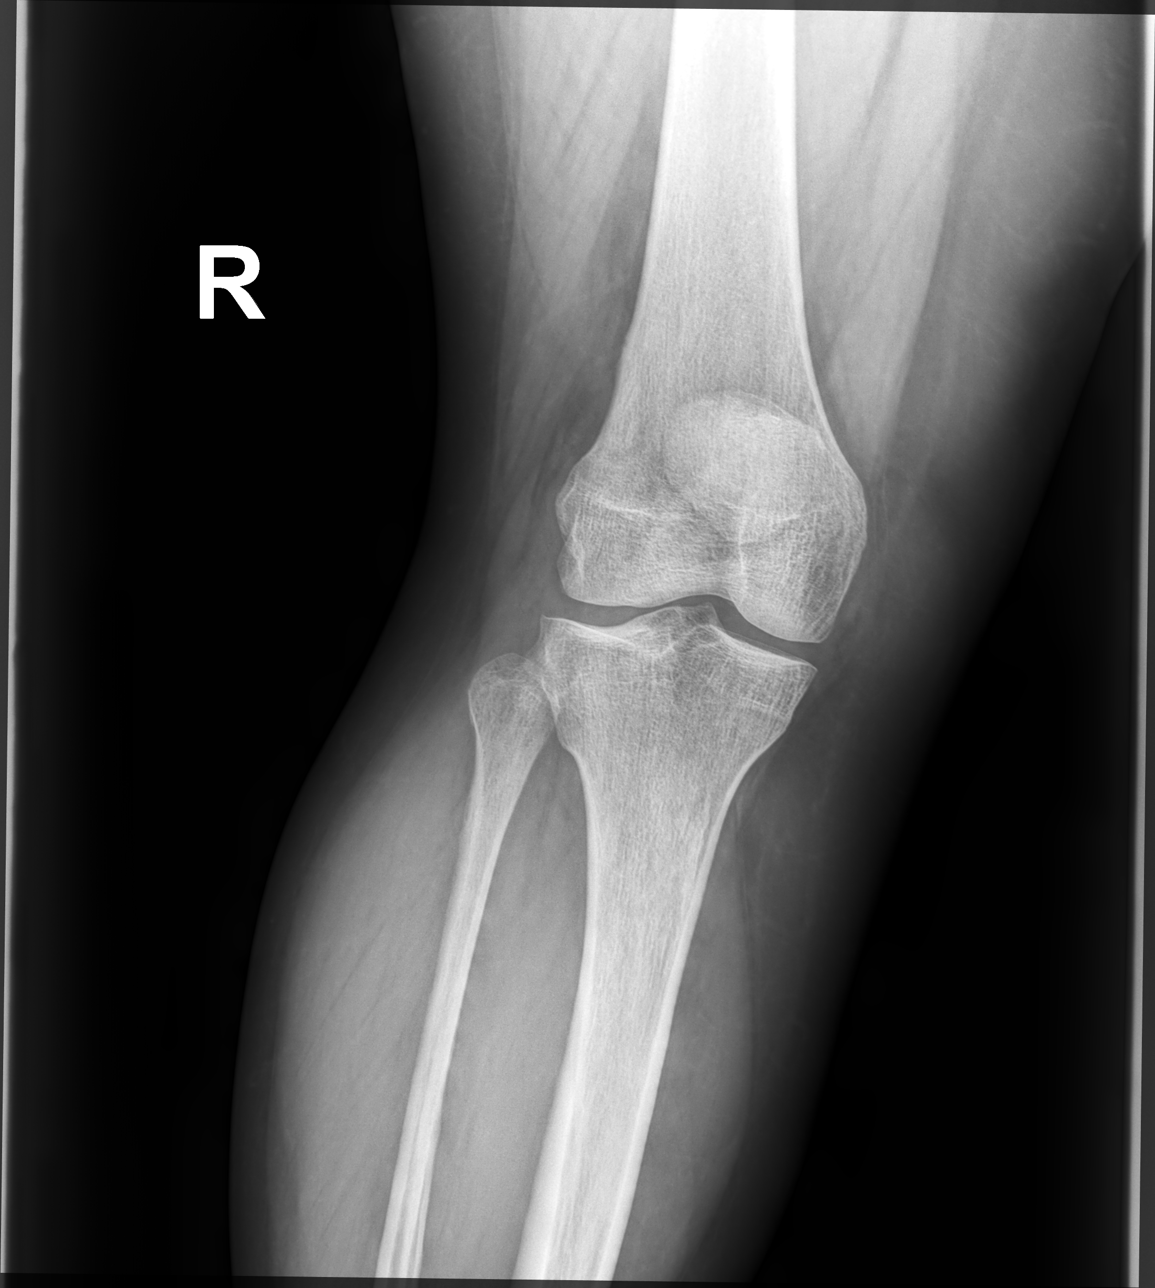

[knee mlo (2 of 2)]
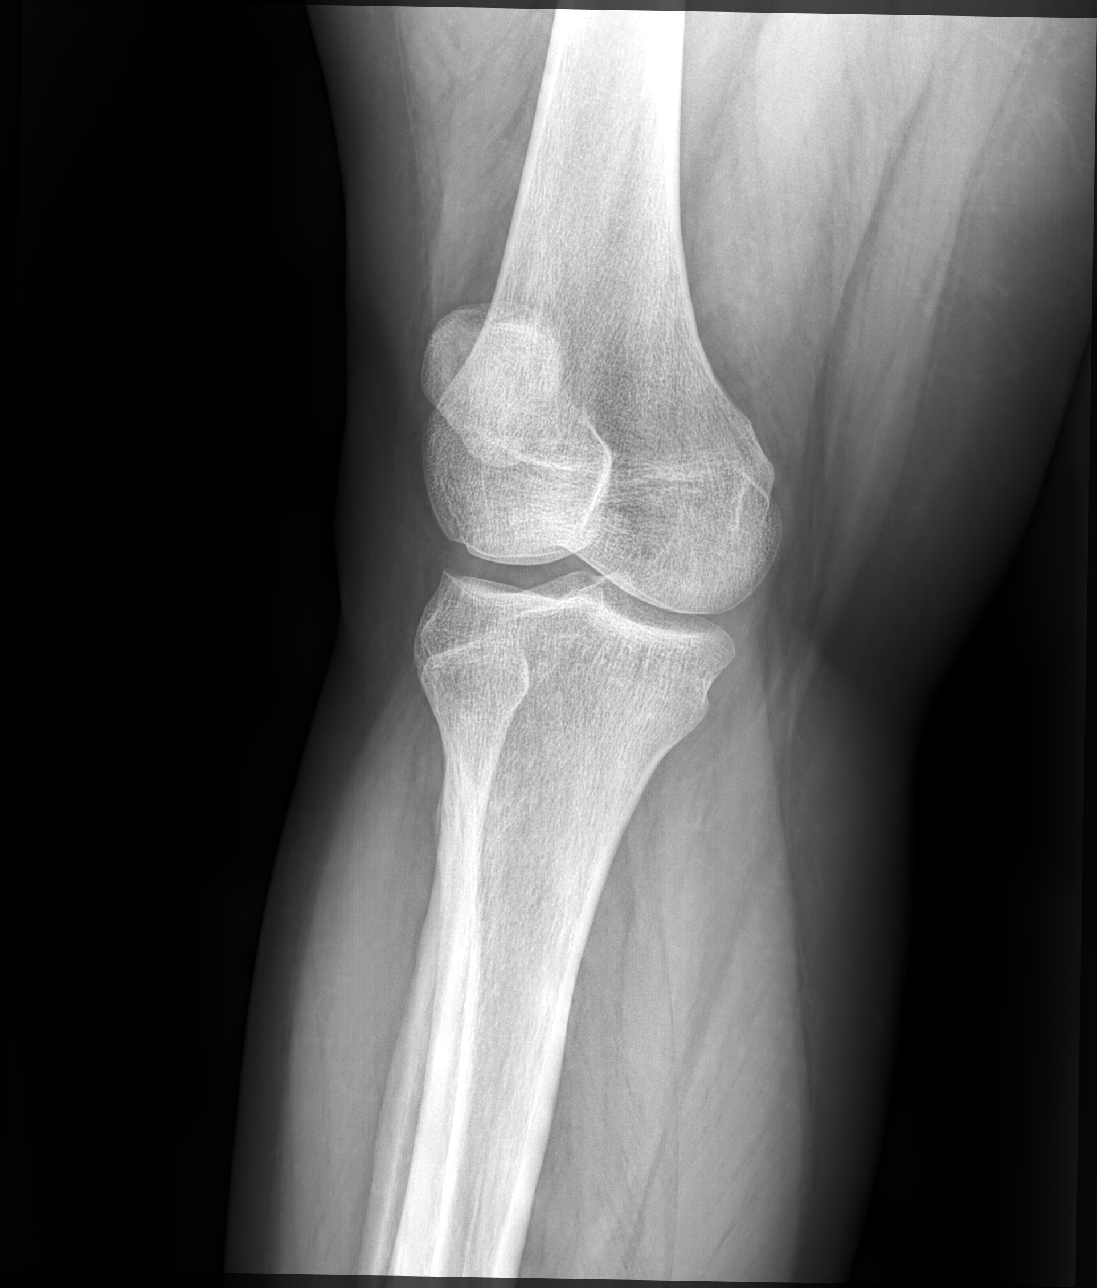

[knee lat]
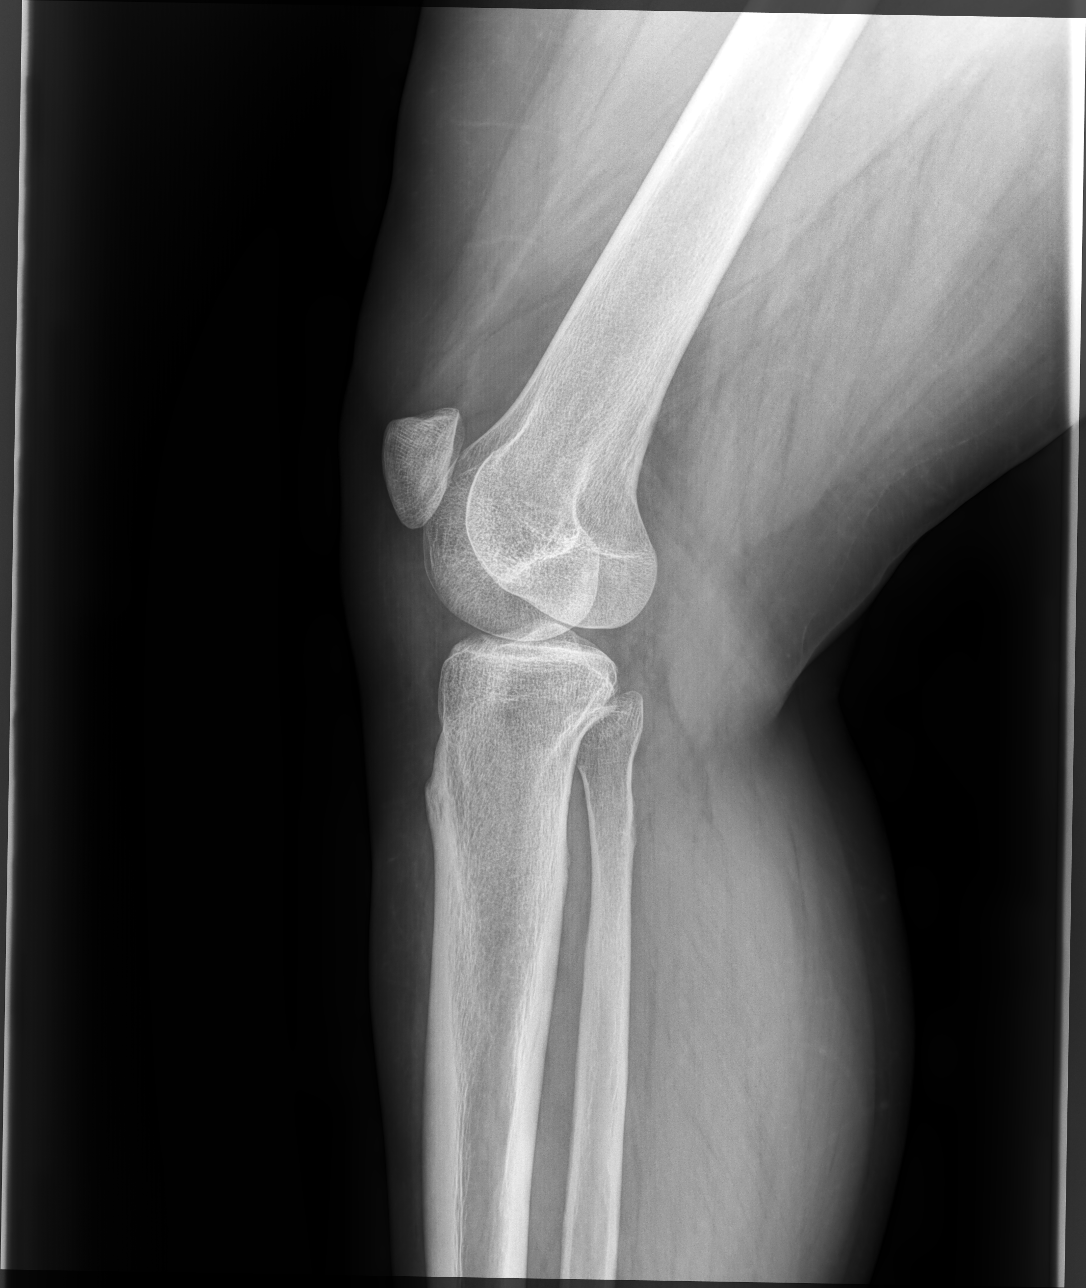

[4 of 4 positions shown; findings below may reference images not displayed]

FINDINGS: No joint effusion. No acute fracture or dislocation. No significant
arthropathy. Soft tissues are unremarkable.
IMPRESSION: 1. No acute findings.

## 2023-03-04 ENCOUNTER — Other Ambulatory Visit (HOSPITAL_COMMUNITY)
Admission: RE | Admit: 2023-03-04 | Discharge: 2023-03-04 | Disposition: A | Discharge status: Home / Self Care | Payer: BC Managed Care – PPO | Source: Ambulatory Visit | Attending: Adult Health | Admitting: Adult Health

## 2023-03-04 ENCOUNTER — Ambulatory Visit (INDEPENDENT_AMBULATORY_CARE_PROVIDER_SITE_OTHER): Payer: BC Managed Care – PPO | Admitting: Adult Health

## 2023-03-04 ENCOUNTER — Encounter: Payer: Self-pay | Admitting: Adult Health

## 2023-03-04 VITALS — BP 153/86 | HR 77 | Ht 63.0 in | Wt 314.0 lb

## 2023-03-04 DIAGNOSIS — Z01419 Encounter for gynecological examination (general) (routine) without abnormal findings: Secondary | ICD-10-CM | POA: Insufficient documentation

## 2023-03-04 DIAGNOSIS — Z0000 Encounter for general adult medical examination without abnormal findings: Secondary | ICD-10-CM | POA: Diagnosis not present

## 2023-03-04 DIAGNOSIS — Z3202 Encounter for pregnancy test, result negative: Secondary | ICD-10-CM | POA: Diagnosis not present

## 2023-03-04 DIAGNOSIS — N921 Excessive and frequent menstruation with irregular cycle: Secondary | ICD-10-CM

## 2023-03-04 DIAGNOSIS — I10 Essential (primary) hypertension: Secondary | ICD-10-CM

## 2023-03-04 DIAGNOSIS — N946 Dysmenorrhea, unspecified: Secondary | ICD-10-CM

## 2023-03-04 LAB — POCT URINE PREGNANCY: Preg Test, Ur: NEGATIVE

## 2023-03-04 MED ORDER — HYDROCHLOROTHIAZIDE 12.5 MG PO CAPS: 12.5000 mg | ORAL_CAPSULE | Freq: Every day | ORAL | 6 refills | Status: DC

## 2023-03-04 MED ORDER — SLYND 4 MG PO TABS: 1.0000 | ORAL_TABLET | Freq: Every day | ORAL | 0 refills | Status: DC

## 2023-03-04 NOTE — Progress Notes (Signed)
Patient ID: Melanie Solis, female   DOB: Dec 15, 1995, 27 y.o.   MRN: [id number] History of Present Illness: Melanie Solis is a 27 year old white female,single, G0P0, in for a well woman gyn exam and pap. And complains of last period last 3 weeks was heavy and painful with clots. Had to change super tampon every 2 hours.  PCP is Dr Phillips Odor.    Current Medications, Allergies, Past Medical History, Past Surgical History, Family History and Social History were reviewed in [employer] record.     Review of Systems: Patient denies any headaches, hearing loss, fatigue, blurred vision, shortness of breath, chest pain, abdominal pain, problems with bowel movements, urination, or intercourse(not active). No joint pain or mood swings.  See HPI for positives.   Physical Exam:BP (!) 153/86 (BP Location: Left Arm, Patient Position: Sitting, Cuff Size: Normal)   Pulse 77   Ht 5\' 3"  (1.6 m)   Wt (!) 314 lb (142.4 kg)   LMP 02/10/2023   BMI 55.62 kg/m  UPT is negative  General:  Well developed, well nourished, no acute distress Skin:  Warm and dry Neck:  Midline trachea, normal thyroid, good ROM, no lymphadenopathy Lungs; Clear to auscultation bilaterally Breast:  No dominant palpable mass, retraction, or nipple discharge Cardiovascular: Regular rate and rhythm Abdomen:  Soft, non tender, no hepatosplenomegaly Pelvic:  External genitalia is normal in appearance, no lesions.  The vagina is normal in appearance. Urethra has no lesions or masses. The cervix is smooth, pap with GC/CHL and HR HPV genotyping performed.  Uterus is felt to be normal size, shape, and contour.  No adnexal masses or tenderness noted.Bladder is non tender, no masses felt. Extremities/musculoskeletal:  No swelling or varicosities noted, no clubbing or cyanosis Psych:  No mood changes, alert and cooperative,seems happy AA is 3 Fall risk is low    03/04/2023    1:41 PM 01/04/2020    8:54 AM  Depression screen PHQ  2/9  Decreased Interest 1 0  Down, Depressed, Hopeless 2 0  PHQ - 2 Score 3 0  Altered sleeping 2   Tired, decreased energy 2   Change in appetite 2   Feeling bad or failure about yourself  1   Trouble concentrating 2   Moving slowly or fidgety/restless 1   Suicidal thoughts 0   PHQ-9 Score 13    She is on lexapro  and Buspar    03/04/2023    1:41 PM  GAD 7 : Generalized Anxiety Score  Nervous, Anxious, on Edge 1  Control/stop worrying 0  Worry too much - different things 0  Trouble relaxing 0  Restless 0  Easily annoyed or irritable 0  Afraid - awful might happen 0  Total GAD 7 Score 1      Upstream - 03/04/23 1341       Pregnancy Intention Screening   Does the patient want to become pregnant in the next year? No    Does the patient's partner want to become pregnant in the next year? No      Contraception Wrap Up   Current Method Abstinence    End Method Abstinence            Examination chaperoned by Malachy Mood LPN   Impression and Plan: 1. Encounter for gynecological examination with Papanicolaou smear of cervix Pap sent Pap in 3 years if normal Physical in 1 year - Cytology - PAP( [place])  2. Routine general medical examination at a  health care facility Pap sent - Cytology - PAP( [place])  3. Pregnancy examination or test, negative result - POCT urine pregnancy  4. Menorrhagia with irregular cycle Last period lasted 3 weeks was heavy and had clots Will try slynd to see if regulates, 3 packs given can start today, if has sex use condoms  5. Dysmenorrhea Had cramps with last period  6. Hypertension, unspecified type Will start on Microzide 12.5 mg  1 daily Watch salt and sugars  Meds ordered this encounter  Medications   Drospirenone (SLYND) 4 MG TABS    Sig: Take 1 tablet (4 mg total) by mouth daily.    Dispense:  84 tablet    Refill:  0    Order Specific Question:   Supervising Provider    Answer:   Duane Lope H [[id number]]    hydrochlorothiazide (MICROZIDE) 12.5 MG capsule    Sig: Take 1 capsule (12.5 mg total) by mouth daily.    Dispense:  30 capsule    Refill:  6    Order Specific Question:   Supervising Provider    Answer:   Duane Lope H [[id number]]     Will recheck BP in 10 weeks and ROS on starting slynd  Will get labs if has not had any with PCP

## 2023-03-08 LAB — CYTOLOGY - PAP
Chlamydia: NEGATIVE
Comment: NEGATIVE
Comment: NEGATIVE
Comment: NORMAL
Diagnosis: UNDETERMINED — AB
High risk HPV: NEGATIVE
Neisseria Gonorrhea: NEGATIVE

## 2023-03-10 ENCOUNTER — Encounter: Payer: Self-pay | Admitting: Adult Health

## 2023-03-10 DIAGNOSIS — R87610 Atypical squamous cells of undetermined significance on cytologic smear of cervix (ASC-US): Secondary | ICD-10-CM | POA: Insufficient documentation

## 2023-05-13 ENCOUNTER — Encounter: Payer: Self-pay | Admitting: Adult Health

## 2023-05-13 ENCOUNTER — Ambulatory Visit (INDEPENDENT_AMBULATORY_CARE_PROVIDER_SITE_OTHER): Payer: BC Managed Care – PPO | Admitting: Adult Health

## 2023-05-13 VITALS — BP 144/91 | HR 71 | Ht 62.0 in | Wt 320.0 lb

## 2023-05-13 DIAGNOSIS — I10 Essential (primary) hypertension: Secondary | ICD-10-CM | POA: Diagnosis not present

## 2023-05-13 DIAGNOSIS — Z13220 Encounter for screening for lipoid disorders: Secondary | ICD-10-CM

## 2023-05-13 DIAGNOSIS — Z1329 Encounter for screening for other suspected endocrine disorder: Secondary | ICD-10-CM

## 2023-05-13 DIAGNOSIS — N946 Dysmenorrhea, unspecified: Secondary | ICD-10-CM | POA: Diagnosis not present

## 2023-05-13 DIAGNOSIS — Z7689 Persons encountering health services in other specified circumstances: Secondary | ICD-10-CM

## 2023-05-13 DIAGNOSIS — N921 Excessive and frequent menstruation with irregular cycle: Secondary | ICD-10-CM

## 2023-05-13 MED ORDER — NEXTSTELLIS 3-14.2 MG PO TABS: 1.0000 | ORAL_TABLET | Freq: Every day | ORAL | Status: DC

## 2023-05-13 MED ORDER — AMLODIPINE BESYLATE 5 MG PO TABS: 5.0000 mg | ORAL_TABLET | Freq: Every day | ORAL | 3 refills | Status: AC

## 2023-05-13 NOTE — Addendum Note (Signed)
Addended by: Cyril Mourning A on: 05/13/2023 09:50 AM   Modules accepted: Level of Service

## 2023-05-13 NOTE — Progress Notes (Signed)
  Subjective:     Patient ID: Melanie Solis, female   DOB: [birth year], 27 y.o.   MRN: [id number]  HPI Melanie Solis is a 27 year old white female,single, G0P0, back in follow up on periods and BP. Last period was 5/24 to 6/21, it was not as heavy. Taking slynd.      Component Value Date/Time   DIAGPAP (A) 03/04/2023 1349    - Atypical squamous cells of undetermined significance (ASC-US)   DIAGPAP  01/04/2020 0856    - Negative for intraepithelial lesion or malignancy (NILM)   HPVHIGH Negative 03/04/2023 1349   ADEQPAP  03/04/2023 1349    Satisfactory for evaluation; transformation zone component PRESENT.   ADEQPAP  01/04/2020 0856    Satisfactory for evaluation; transformation zone component PRESENT.   PCP is Dr Phillips Odor.  Review of Systems Period still long but not as heavy Not having sex Reviewed past medical,surgical, social and family history. Reviewed medications and allergies.     Objective:   Physical Exam BP (!) 144/91 (BP Location: Right Arm, Patient Position: Sitting, Cuff Size: Large)   Pulse 71   Ht 5\' 2"  (1.575 m)   Wt (!) 320 lb (145.2 kg)   LMP 03/18/2023   BMI 58.53 kg/m     Skin warm and dry.Lungs: clear to ausculation bilaterally. Cardiovascular: regular rate and rhythm.  Fall risk is low  Upstream - 05/13/23 0853       Pregnancy Intention Screening   Does the patient want to become pregnant in the next year? No    Does the patient's partner want to become pregnant in the next year? No    Would the patient like to discuss contraceptive options today? No      Contraception Wrap Up   Current Method Abstinence;Oral Contraceptive    End Method Abstinence;Oral Contraceptive             Assessment:     1. Menorrhagia with irregular cycle Period still long but not has heavy  Will change BCP to nextstellis, finish current pack of slynd and then start nextstellis, if has sex use condom for 1 pack  Will check labs - CBC - TSH + free T4  2.  Dysmenorrhea  3. Hypertension, unspecified type Continue microzide 12.5 mg 1 daily will add Norvasc 5 mg 1 daily   Meds ordered this encounter  Medications   Drospirenone-Estetrol (NEXTSTELLIS) 3-14.2 MG TABS    Sig: Take 1 tablet by mouth daily.    Order Specific Question:   Supervising Provider    Answer:   Lazaro Arms [[id number]]   amLODipine (NORVASC) 5 MG tablet    Sig: Take 1 tablet (5 mg total) by mouth daily.    Dispense:  30 tablet    Refill:  3    Order Specific Question:   Supervising Provider    Answer:   Duane Lope H [[id number]]   Continue to watch salt and sugars  - Comprehensive metabolic panel  4. Encounter for menstrual regulation Gave 4 packs of nextstellis to start when finishes this pack of slynd.  5. Screening cholesterol level - Lipid panel  6. Screening for thyroid disorder - TSH + free T4     Plan:     Follow up 06/16/23 for ROS

## 2023-05-16 LAB — COMPREHENSIVE METABOLIC PANEL
ALT: 19 IU/L (ref 0–32)
AST: 13 IU/L (ref 0–40)
Albumin: 4.5 g/dL (ref 4.0–5.0)
Alkaline Phosphatase: 88 IU/L (ref 44–121)
BUN/Creatinine Ratio: 14 (ref 9–23)
BUN: 11 mg/dL (ref 6–20)
Bilirubin Total: 0.2 mg/dL (ref 0.0–1.2)
CO2: 23 mmol/L (ref 20–29)
Calcium: 10 mg/dL (ref 8.7–10.2)
Chloride: 101 mmol/L (ref 96–106)
Creatinine, Ser: 0.77 mg/dL (ref 0.57–1.00)
Globulin, Total: 2.4 g/dL (ref 1.5–4.5)
Glucose: 141 mg/dL — ABNORMAL HIGH (ref 70–99)
Potassium: 4.2 mmol/L (ref 3.5–5.2)
Sodium: 138 mmol/L (ref 134–144)
Total Protein: 6.9 g/dL (ref 6.0–8.5)
eGFR: 108 mL/min/{1.73_m2} (ref >59)

## 2023-05-16 LAB — TSH+FREE T4
Free T4: 0.93 ng/dL (ref 0.82–1.77)
TSH: 3.14 u[IU]/mL (ref 0.450–4.500)

## 2023-05-16 LAB — LIPID PANEL
Chol/HDL Ratio: 3.6 ratio (ref 0.0–4.4)
Cholesterol, Total: 204 mg/dL — ABNORMAL HIGH (ref 100–199)
HDL: 56 mg/dL (ref >39)
LDL Chol Calc (NIH): 114 mg/dL — ABNORMAL HIGH (ref 0–99)
Triglycerides: 194 mg/dL — ABNORMAL HIGH (ref 0–149)
VLDL Cholesterol Cal: 34 mg/dL (ref 5–40)

## 2023-05-16 LAB — CBC
Hematocrit: 42.2 % (ref 34.0–46.6)
Hemoglobin: 13.2 g/dL (ref 11.1–15.9)
MCH: 24.8 pg — ABNORMAL LOW (ref 26.6–33.0)
MCHC: 31.3 g/dL — ABNORMAL LOW (ref 31.5–35.7)
MCV: 79 fL (ref 79–97)
Platelets: 325 10*3/uL (ref 150–450)
RBC: 5.32 x10E6/uL — ABNORMAL HIGH (ref 3.77–5.28)
RDW: 14.8 % (ref 11.7–15.4)
WBC: 8.8 10*3/uL (ref 3.4–10.8)

## 2023-06-16 ENCOUNTER — Ambulatory Visit: Payer: BC Managed Care – PPO | Admitting: Adult Health

## 2023-06-24 ENCOUNTER — Ambulatory Visit: Payer: BC Managed Care – PPO | Admitting: Adult Health

## 2023-08-01 ENCOUNTER — Ambulatory Visit (INDEPENDENT_AMBULATORY_CARE_PROVIDER_SITE_OTHER): Payer: BC Managed Care – PPO | Admitting: Obstetrics & Gynecology

## 2023-08-01 ENCOUNTER — Encounter: Payer: Self-pay | Admitting: Obstetrics & Gynecology

## 2023-08-01 ENCOUNTER — Other Ambulatory Visit (HOSPITAL_COMMUNITY)
Admission: RE | Admit: 2023-08-01 | Discharge: 2023-08-01 | Disposition: A | Discharge status: Home / Self Care | Payer: BC Managed Care – PPO | Source: Ambulatory Visit | Attending: Obstetrics & Gynecology | Admitting: Obstetrics & Gynecology

## 2023-08-01 VITALS — BP 139/85 | HR 74 | Ht 62.0 in | Wt 325.8 lb

## 2023-08-01 DIAGNOSIS — N939 Abnormal uterine and vaginal bleeding, unspecified: Secondary | ICD-10-CM | POA: Insufficient documentation

## 2023-08-01 DIAGNOSIS — Z6841 Body Mass Index (BMI) 40.0 and over, adult: Secondary | ICD-10-CM

## 2023-08-01 MED ORDER — NORETHINDRONE ACETATE 5 MG PO TABS: 5.0000 mg | ORAL_TABLET | Freq: Every day | ORAL | 4 refills | Status: DC

## 2023-08-01 NOTE — Progress Notes (Signed)
GYN VISIT Patient name: Melanie Solis MRN [id number]  Date of birth: 1996/06/26 Chief Complaint:   Period has been on for 8 weeks (Painful, tried different birth control pills)  History of Present Illness:   Melanie Solis is a 27 y.o. G0P0000  female being seen today for the following concerns:  AUB: This has been going on for at least the past year.  She has tried several different pills as well as Nexplanon in the past.  Prior to OCPs, period will last for 3-4 weeks.  She would be off of it for a few days and then it would start back.  Currently on Nextstellis since July.  Last week of July started bleeding again and has not been off her period since that time.  Sometimes it's very light other times it's super heavy.  On a heavy day may need to change her tampon every 2 hours (super plus).  Notes considerable pelvic pain- taking tylenol.  Will ease the pain for a little bit.  -Denies HA, dizziness or SOB.  Reports no other acute concerns  She does not ever want to have a child.  Patient's last menstrual period was 05/28/2023.    Review of Systems:   Pertinent items are noted in HPI Denies fever/chills, dizziness, headaches, visual disturbances, fatigue, shortness of breath, chest pain, abdominal pain, vomiting.  Pertinent History Reviewed:   Past Surgical History:  Procedure Laterality Date   WISDOM TOOTH EXTRACTION      Past Medical History:  Diagnosis Date   Anxiety    Dysmenorrhea 12/06/2013   Hypertension    Menorrhagia 12/06/2013   Menstrual extraction 12/06/2013   Will try OCs   Obesity    Vaginal Pap smear, abnormal    Reviewed problem list, medications and allergies. Physical Assessment:   Vitals:   08/01/23 0852  BP: 139/85  Pulse: 74  Weight: (!) 325 lb 12.8 oz (147.8 kg)  Height: 5\' 2"  (1.575 m)  Body mass index is 59.59 kg/m.       Physical Examination:   General appearance: alert, well appearing, and in no distress  Psych: mood appropriate,  normal affect  Skin: warm & dry   Cardiovascular: normal heart rate noted  Respiratory: normal respiratory effort, no distress  Abdomen: obese, soft, non-tender, no rebound, no guarding  Pelvic: VULVA: normal appearing vulva with no masses, tenderness or lesions, VAGINA: normal appearing vagina with normal color and discharge, no lesions, CERVIX: normal appearing cervix without discharge or lesions.  Some difficulty with visualization of cervix.  Bimanual exam limited due to body habitus  Extremities: no edema, no calf tenderness bilaterally  Chaperone: Jobe Marker    Endometrial Biopsy Procedure Note  Pre-operative Diagnosis: AUB  Post-operative Diagnosis: same  Procedure Details  Pt not sexually active.  The risks (including infection, bleeding, pain, and uterine perforation) and benefits of the procedure were explained to the patient and Written informed consent was obtained.  Antibiotic prophylaxis against endocarditis was not indicated.   The patient was placed in the dorsal lithotomy position. A speculum inserted in the vagina, and the cervix prepped with betadine.     A single tooth tenaculum was applied to the anterior lip of the cervix for stabilization.  Endometrial canal seemed tilted to patient's left- ?fibroid/polyp within canal. Os finder was used.  A Pipelle endometrial aspirator was used to sample the endometrium.  Sample was sent for pathologic examination.  Condition: Stable  Complications: None   Assessment & Plan:  1) AUB, morbid obesity -suspect AUB likely due to obesity and possibly PCOS -advised EMB today since pt has not seen much improvement with medical management -plan for pelvic US to r/o underlying etiology -reviewed current options including transition from combined pill to progestin only -consideration for IUD (mirena) or possible surgical intervention including endometrial ablation or hysterectomy -due to young age though pt states she does  not desire childbearing, would consider reversible options as first line management -questions/concerns about each option were reviewed -plan for Aygestin daily, f/u in 3mos -EMB completed as above, pathology pending   Meds ordered this encounter  Medications   norethindrone (AYGESTIN) 5 MG tablet    Sig: Take 1 tablet (5 mg total) by mouth daily.    Dispense:  30 tablet    Refill:  4      Return in about 3 months (around 11/01/2023) for Medication follow up.   Myna Hidalgo, DO Attending Obstetrician & Gynecologist, [facility] for [employer], [facility]

## 2023-08-04 LAB — SURGICAL PATHOLOGY

## 2023-08-06 ENCOUNTER — Encounter: Payer: Self-pay | Admitting: Obstetrics & Gynecology

## 2023-08-08 ENCOUNTER — Ambulatory Visit: Payer: BC Managed Care – PPO

## 2023-08-08 DIAGNOSIS — N939 Abnormal uterine and vaginal bleeding, unspecified: Secondary | ICD-10-CM

## 2023-08-08 NOTE — Progress Notes (Signed)
PELVIC US TA/TV: homogeneous axial positioned uterus,EEC 7.1 mm,normal ovaries,no free fluid,no pain during ultrasound  Chaperone [employer]

## 2023-08-14 NOTE — Telephone Encounter (Signed)
Called pt to review.  Plan to schedule for Nov 12- Hysteroscopy, D&C, Hydrothermal ablation  Myna Hidalgo, DO Attending Obstetrician & Gynecologist, [facility] for [employer], [facility]

## 2023-08-18 ENCOUNTER — Encounter: Payer: Self-pay | Admitting: Obstetrics & Gynecology

## 2023-09-03 NOTE — Patient Instructions (Signed)
Melanie Solis  09/03/2023     @PREFPERIOPPHARMACY @   Your procedure is scheduled on  09/09/2023.   [facility] at  [phone] A.M.   Call this number if you have problems the morning of surgery:  [phone]  If you experience any cold or flu symptoms such as cough, fever, chills, shortness of breath, etc. between now and your scheduled surgery, please notify us at the above number.   Remember:  Do not eat after midnight.    You may drink clear liquids until 0645 am on 09/09/2023.      Clear liquids allowed are:                    Water, Juice (No red color; non-citric and without pulp; diabetics please choose diet or no sugar options), Carbonated beverages (diabetics please choose diet or no sugar options), Clear Tea (No creamer, milk, or cream, including half & half and powdered creamer), Black Coffee Only (No creamer, milk or cream, including half & half and powdered creamer), and Clear Sports drink (No red color; diabetics please choose diet or no sugar options)    Take these medicines the morning of surgery with A SIP OF WATER                                        amlodipine.    Do not wear jewelry, make-up or nail polish, including gel polish,  artificial nails, or any other type of covering on natural nails (fingers and  toes).  Do not wear lotions, powders, or perfumes, or deodorant.  Do not shave 48 hours prior to surgery.  Men may shave face and neck.  Do not bring valuables to the hospital.  [facility] is not responsible for any belongings or valuables.  Contacts, dentures or bridgework may not be worn into surgery.  Leave your suitcase in the car.  After surgery it may be brought to your room.  For patients admitted to the hospital, discharge time will be determined by your treatment team.  Patients discharged the day of surgery will not be allowed to drive home and must have someone with them for 24 hours.    Special instructions:   DO NOT  smoke tobacco or vape for 24 hours before your procedure.  Please read over the following fact sheets that you were given. Coughing and Deep Breathing, Surgical Site Infection Prevention, Anesthesia Post-op Instructions, and Care and Recovery After Surgery      Dilation and Curettage or Vacuum Curettage, Care After The following information offers guidance on how to care for yourself after your procedure. Your doctor may also give you more specific instructions. If you have problems or questions, contact your doctor. What can I expect after the procedure? After the procedure, it is common to have: Mild pain or cramps. Some bleeding or spotting from the vagina. These may last for up to 2 weeks. Follow these instructions at home: Medicines Take over-the-counter and prescription medicines only as told by your doctor. If told, take steps to prevent problems with pooping (constipation). You may need to: Drink enough fluid to keep your pee (urine) pale yellow. Take medicines. You will be told what medicines to take. Eat foods that are high in fiber. These include beans, whole grains, and fresh fruits and vegetables. Limit foods that are  high in fat and sugar. These include fried or sweet foods. Ask your doctor if you should avoid driving or using machines while you are taking your medicine. Activity  If you were given a medicine to help you relax (sedative) during your procedure, it can affect you for many hours. Do not drive or use machinery until your doctor says that it is safe. Rest as told by your doctor. Get up to take short walks every 1-2 hours. Ask for help if you feel weak or unsteady. Do not lift anything that is heavier than 10 lb (4.5 kg), or the limit that you are told. Return to your normal activities when your doctor says that it is safe. Lifestyle For at least 2 weeks, or as long as told by your doctor: Do not douche. Do not use tampons. Do not have sex. General  instructions Do not take baths, swim, or use a hot tub. Ask your doctor if you may take showers. Do not smoke or use any products that contain nicotine or tobacco. These can delay healing. If you need help quitting, ask your doctor. Wear compression stockings as told by your doctor. It is up to you to get the results of your procedure. Ask how to get your results when they are ready. Keep all follow-up visits. Contact a doctor if: You have very bad cramps that get worse or do not get better with medicine. You have very bad pain in your belly (abdomen). You cannot drink fluids without vomiting. You have pain in the area just above your thighs. You have fluid from your vagina that smells bad. You have a rash. Get help right away if: You are bleeding a lot from your vagina. This means soaking more than one sanitary pad in 1 hour, and this happens for 2 hours in a row. You have a fever that is above 100.54F (38C). Your belly feels very tender or hard. You have chest pain. You have trouble breathing. You feel dizzy or light-headed. You faint. You have pain in your neck or shoulder area. These symptoms may be an emergency. Get help right away. Call your local emergency services (911 in the [place] Do not wait to see if the symptoms will go away. Do not drive yourself to the hospital. Summary After your procedure, it is common to have pain or cramping. It is also common to have bleeding or spotting from your vagina. Rest as told. Get up to take short walks every 1-2 hours. Do not lift anything that is heavier than 10 lb (4.5 kg), or the limit that you are told. Get help right away if you have problems from the procedure. Ask your doctor what problems to watch for. This information is not intended to replace advice given to you by your health care provider. Make sure you discuss any questions you have with your health care provider. Document Revised: 10/04/2020 Document Reviewed:  10/04/2020 Elsevier Patient Education  2024 [employer] General Anesthesia, Adult, Care After The following information offers guidance on how to care for yourself after your procedure. Your health care provider may also give you more specific instructions. If you have problems or questions, contact your health care provider. What can I expect after the procedure? After the procedure, it is common for people to: Have pain or discomfort at the IV site. Have nausea or vomiting. Have a sore throat or hoarseness. Have trouble concentrating. Feel cold or chills. Feel weak, sleepy, or tired (fatigue). Have soreness and  body aches. These can affect parts of the body that were not involved in surgery. Follow these instructions at home: For the time period you were told by your health care provider:  Rest. Do not participate in activities where you could fall or become injured. Do not drive or use machinery. Do not drink alcohol. Do not take sleeping pills or medicines that cause drowsiness. Do not make important decisions or sign legal documents. Do not take care of children on your own. General instructions Drink enough fluid to keep your urine pale yellow. If you have sleep apnea, surgery and certain medicines can increase your risk for breathing problems. Follow instructions from your health care provider about wearing your sleep device: Anytime you are sleeping, including during daytime naps. While taking prescription pain medicines, sleeping medicines, or medicines that make you drowsy. Return to your normal activities as told by your health care provider. Ask your health care provider what activities are safe for you. Take over-the-counter and prescription medicines only as told by your health care provider. Do not use any products that contain nicotine or tobacco. These products include cigarettes, chewing tobacco, and vaping devices, such as e-cigarettes. These can delay incision  healing after surgery. If you need help quitting, ask your health care provider. Contact a health care provider if: You have nausea or vomiting that does not get better with medicine. You vomit every time you eat or drink. You have pain that does not get better with medicine. You cannot urinate or have bloody urine. You develop a skin rash. You have a fever. Get help right away if: You have trouble breathing. You have chest pain. You vomit blood. These symptoms may be an emergency. Get help right away. Call 911. Do not wait to see if the symptoms will go away. Do not drive yourself to the hospital. Summary After the procedure, it is common to have a sore throat, hoarseness, nausea, vomiting, or to feel weak, sleepy, or fatigue. For the time period you were told by your health care provider, do not drive or use machinery. Get help right away if you have difficulty breathing, have chest pain, or vomit blood. These symptoms may be an emergency. This information is not intended to replace advice given to you by your health care provider. Make sure you discuss any questions you have with your health care provider. Document Revised: 01/11/2022 Document Reviewed: 01/11/2022 Elsevier Patient Education  2024 Else[employer] Inc. How to Use Chlorhexidine Before Surgery Chlorhexidine gluconate (CHG) is a germ-killing (antiseptic) solution that is used to clean the skin. It can get rid of the bacteria that normally live on the skin and can keep them away for about 24 hours. To clean your skin with CHG, you may be given: A CHG solution to use in the shower or as part of a sponge bath. A prepackaged cloth that contains CHG. Cleaning your skin with CHG may help lower the risk for infection: While you are staying in the intensive care unit of the hospital. If you have a vascular access, such as a central line, to provide short-term or long-term access to your veins. If you have a catheter to drain urine from  your bladder. If you are on a ventilator. A ventilator is a machine that helps you breathe by moving air in and out of your lungs. After surgery. What are the risks? Risks of using CHG include: A skin reaction. Hearing loss, if CHG gets in your ears and you have  a perforated eardrum. Eye injury, if CHG gets in your eyes and is not rinsed out. The CHG product catching fire. Make sure that you avoid smoking and flames after applying CHG to your skin. Do not use CHG: If you have a chlorhexidine allergy or have previously reacted to chlorhexidine. On babies younger than 30 months of age. How to use CHG solution Use CHG only as told by your health care provider, and follow the instructions on the label. Use the full amount of CHG as directed. Usually, this is one bottle. During a shower Follow these steps when using CHG solution during a shower (unless your health care provider gives you different instructions): Start the shower. Use your normal soap and shampoo to wash your face and hair. Turn off the shower or move out of the shower stream. Pour the CHG onto a clean washcloth. Do not use any type of brush or rough-edged sponge. Starting at your neck, lather your body down to your toes. Make sure you follow these instructions: If you will be having surgery, pay special attention to the part of your body where you will be having surgery. Scrub this area for at least 1 minute. Do not use CHG on your head or face. If the solution gets into your ears or eyes, rinse them well with water. Avoid your genital area. Avoid any areas of skin that have broken skin, cuts, or scrapes. Scrub your back and under your arms. Make sure to wash skin folds. Let the lather sit on your skin for 1-2 minutes or as long as told by your health care provider. Thoroughly rinse your entire body in the shower. Make sure that all body creases and crevices are rinsed well. Dry off with a clean towel. Do not put any  substances on your body afterward--such as powder, lotion, or perfume--unless you are told to do so by your health care provider. Only use lotions that are recommended by the manufacturer. Put on clean clothes or pajamas. If it is the night before your surgery, sleep in clean sheets.  During a sponge bath Follow these steps when using CHG solution during a sponge bath (unless your health care provider gives you different instructions): Use your normal soap and shampoo to wash your face and hair. Pour the CHG onto a clean washcloth. Starting at your neck, lather your body down to your toes. Make sure you follow these instructions: If you will be having surgery, pay special attention to the part of your body where you will be having surgery. Scrub this area for at least 1 minute. Do not use CHG on your head or face. If the solution gets into your ears or eyes, rinse them well with water. Avoid your genital area. Avoid any areas of skin that have broken skin, cuts, or scrapes. Scrub your back and under your arms. Make sure to wash skin folds. Let the lather sit on your skin for 1-2 minutes or as long as told by your health care provider. Using a different clean, wet washcloth, thoroughly rinse your entire body. Make sure that all body creases and crevices are rinsed well. Dry off with a clean towel. Do not put any substances on your body afterward--such as powder, lotion, or perfume--unless you are told to do so by your health care provider. Only use lotions that are recommended by the manufacturer. Put on clean clothes or pajamas. If it is the night before your surgery, sleep in clean sheets. How to use  CHG prepackaged cloths Only use CHG cloths as told by your health care provider, and follow the instructions on the label. Use the CHG cloth on clean, dry skin. Do not use the CHG cloth on your head or face unless your health care provider tells you to. When washing with the CHG cloth: Avoid  your genital area. Avoid any areas of skin that have broken skin, cuts, or scrapes. Before surgery Follow these steps when using a CHG cloth to clean before surgery (unless your health care provider gives you different instructions): Using the CHG cloth, vigorously scrub the part of your body where you will be having surgery. Scrub using a back-and-forth motion for 3 minutes. The area on your body should be completely wet with CHG when you are done scrubbing. Do not rinse. Discard the cloth and let the area air-dry. Do not put any substances on the area afterward, such as powder, lotion, or perfume. Put on clean clothes or pajamas. If it is the night before your surgery, sleep in clean sheets.  For general bathing Follow these steps when using CHG cloths for general bathing (unless your health care provider gives you different instructions). Use a separate CHG cloth for each area of your body. Make sure you wash between any folds of skin and between your fingers and toes. Wash your body in the following order, switching to a new cloth after each step: The front of your neck, shoulders, and chest. Both of your arms, under your arms, and your hands. Your stomach and groin area, avoiding the genitals. Your right leg and foot. Your left leg and foot. The back of your neck, your back, and your buttocks. Do not rinse. Discard the cloth and let the area air-dry. Do not put any substances on your body afterward--such as powder, lotion, or perfume--unless you are told to do so by your health care provider. Only use lotions that are recommended by the manufacturer. Put on clean clothes or pajamas. Contact a health care provider if: Your skin gets irritated after scrubbing. You have questions about using your solution or cloth. You swallow any chlorhexidine. Call your local poison control center ([phone] in the [place] Get help right away if: Your eyes itch badly, or they become very red or  swollen. Your skin itches badly and is red or swollen. Your hearing changes. You have trouble seeing. You have swelling or tingling in your mouth or throat. You have trouble breathing. These symptoms may represent a serious problem that is an emergency. Do not wait to see if the symptoms will go away. Get medical help right away. Call your local emergency services (911 in the [place] Do not drive yourself to the hospital. Summary Chlorhexidine gluconate (CHG) is a germ-killing (antiseptic) solution that is used to clean the skin. Cleaning your skin with CHG may help to lower your risk for infection. You may be given CHG to use for bathing. It may be in a bottle or in a prepackaged cloth to use on your skin. Carefully follow your health care provider's instructions and the instructions on the product label. Do not use CHG if you have a chlorhexidine allergy. Contact your health care provider if your skin gets irritated after scrubbing. This information is not intended to replace advice given to you by your health care provider. Make sure you discuss any questions you have with your health care provider. Document Revised: 02/11/2022 Document Reviewed: 12/25/2020 Elsevier Patient Education  2023 [employer].

## 2023-09-04 ENCOUNTER — Other Ambulatory Visit: Payer: Self-pay

## 2023-09-04 ENCOUNTER — Encounter (HOSPITAL_COMMUNITY)
Admission: RE | Admit: 2023-09-04 | Discharge: 2023-09-04 | Disposition: A | Discharge status: Home / Self Care | Payer: BC Managed Care – PPO | Source: Ambulatory Visit | Attending: Obstetrics & Gynecology | Admitting: Obstetrics & Gynecology

## 2023-09-04 ENCOUNTER — Encounter (HOSPITAL_COMMUNITY): Payer: Self-pay

## 2023-09-04 DIAGNOSIS — I10 Essential (primary) hypertension: Secondary | ICD-10-CM | POA: Diagnosis not present

## 2023-09-04 DIAGNOSIS — Z01818 Encounter for other preprocedural examination: Secondary | ICD-10-CM | POA: Insufficient documentation

## 2023-09-04 DIAGNOSIS — N921 Excessive and frequent menstruation with irregular cycle: Secondary | ICD-10-CM | POA: Diagnosis not present

## 2023-09-04 LAB — CBC
HCT: 37.8 % (ref 36.0–46.0)
Hemoglobin: 12.4 g/dL (ref 12.0–15.0)
MCH: 26.1 pg (ref 26.0–34.0)
MCHC: 32.8 g/dL (ref 30.0–36.0)
MCV: 79.6 fL — ABNORMAL LOW (ref 80.0–100.0)
Platelets: 275 10*3/uL (ref 150–400)
RBC: 4.75 MIL/uL (ref 3.87–5.11)
RDW: 14.7 % (ref 11.5–15.5)
WBC: 8.8 10*3/uL (ref 4.0–10.5)
nRBC: 0 % (ref 0.0–0.2)

## 2023-09-04 LAB — PREGNANCY, URINE: Preg Test, Ur: NEGATIVE

## 2023-09-04 NOTE — Pre-Procedure Instructions (Signed)
PAT visit completed. Pt verbalized understanding on procedure instructions and arrival time.   

## 2023-09-07 NOTE — H&P (Signed)
[facility] Attending History and Physical  Melanie Solis is a 27 y.o. G0P0000 who presents for hysteroscopy, D&C, HTA ablation due to heavy menstrual bleeding.  In review, This has been going on for at least the past year.  She has tried several different pills as well as Nexplanon in the past.  Prior to OCPs, period will last for 3-4 weeks.  She would be off of it for a few days and then it would start back. She is currently not bleeding for the last 3 days. She has been on Nextstellis since July with minimal improvement. She also reports dysmenorrhea.  At this time, she does not desire a pregnancy in the future and wishes to proceed with endometrial ablation.  Pt is aware that today's procedure is NOT contraception.  As part of he work up: EMB normal [place] 07/2023: 6.7 x 3.5 x 3.7 cm, Total uterine volume 45 cc,homogeneous axial positioned uterus,WNL .  Normal endometrium  Denies any abnormal vaginal discharge, fevers, chills, sweats, dysuria, nausea, vomiting, other GI or GU symptoms or other general symptoms.  Past Medical History:  Diagnosis Date   Anxiety    Dysmenorrhea 12/06/2013   Hypertension    Menorrhagia 12/06/2013   Menstrual extraction 12/06/2013   Will try OCs   Obesity    Vaginal Pap smear, abnormal    Past Surgical History:  Procedure Laterality Date   WISDOM TOOTH EXTRACTION     OB History  Gravida Para Term Preterm AB Living  0 0 0 0 0 0  SAB IAB Ectopic Multiple Live Births  0 0 0 0    Patient denies any other pertinent gynecologic issues.  No current facility-administered medications on file prior to encounter.   Current Outpatient Medications on File Prior to Encounter  Medication Sig Dispense Refill   Bioflavonoid Products (VITAMIN C) CHEW Chew 1 tablet by mouth in the morning. Gummy     ELDERBERRY PO Take 1 tablet by mouth in the morning. Gummy     escitalopram (LEXAPRO) 20 MG tablet Take 20 mg by mouth at bedtime.      amLODipine (NORVASC) 5 MG tablet Take 1 tablet (5 mg total) by mouth daily. 30 tablet 3   norethindrone (AYGESTIN) 5 MG tablet Take 1 tablet (5 mg total) by mouth daily. (Patient not taking: Reported on 08/29/2023) 30 tablet 4   No Known Allergies  Social History:   reports that she has never smoked. She has never used smokeless tobacco. She reports current alcohol use. She reports that she does not use drugs. Family History  Problem Relation Age of Onset   Cancer Mother        skin    Review of Systems: Pertinent items noted in HPI and remainder of comprehensive ROS otherwise negative.  PHYSICAL EXAM: BP (!) 146/93   Pulse 86   Temp 97.6 F (36.4 C) (Oral)   Resp 14   Ht 5\' 2"  (1.575 m)   Wt 127 kg   SpO2 100%   BMI 51.21 kg/m   CONSTITUTIONAL: Well-developed, well-nourished female in no acute distress.  SKIN: Skin is warm and dry. No rash noted. Not diaphoretic. No erythema. No pallor. NEUROLOGIC: Alert and oriented to person, place, and time. Normal reflexes, muscle tone coordination. No cranial nerve deficit noted. PSYCHIATRIC: Normal mood and affect. Normal behavior. Normal judgment and thought content. CARDIOVASCULAR: Normal heart rate noted, regular rhythm RESPIRATORY: Effort and breath sounds normal, no problems with respiration noted ABDOMEN:  Soft, nontender, nondistended. PELVIC: deferred MUSCULOSKELETAL: no calf tenderness bilaterally EXT: no edema bilaterally, normal pulses  Labs: Results for orders placed or performed during the hospital encounter of 09/04/23 (from the past 336 hour(s))  CBC   Collection Time: 09/04/23 12:11 PM  Result Value Ref Range   WBC 8.8 4.0 - 10.5 K/uL   RBC 4.75 3.87 - 5.11 MIL/uL   Hemoglobin 12.4 12.0 - 15.0 g/dL   HCT 52.8 41.3 - 24.4 %   MCV 79.6 (L) 80.0 - 100.0 fL   MCH 26.1 26.0 - 34.0 pg   MCHC 32.8 30.0 - 36.0 g/dL   RDW 01.0 27.2 - 53.6 %   Platelets 275 150 - 400 K/uL   nRBC 0.0 0.0 - 0.2 %  Pregnancy, urine    Collection Time: 09/04/23 12:11 PM  Result Value Ref Range   Preg Test, Ur NEGATIVE NEGATIVE    Imaging Studies: [place] 07/2023: 6.7 x 3.5 x 3.7 cm, Total uterine volume 45 cc,homogeneous axial positioned uterus,WNL .  Normal endometrium  Assessment: Heavy menstrual bleeding   Plan: Hysteroscopy, D&C, Hydrothermal ablation -IV Toradol -NPO -LR @ 125cc/hr -SCDs to OR -Risk/benefits and alternatives reviewed with the patient including but not limited to risk of bleeding, infection and injury such as uterine perforation with potential injury to surrounding organs requiring further surgical intervention.  Also discussed potential failure of ablation.   -again reviewed with patient that ablation is not a form of contraception -Questions and concerns were addressed and pt desires to proceed  Myna Hidalgo, DO Attending Obstetrician & Gynecologist, [facility] [employer], [facility]

## 2023-09-08 NOTE — Anesthesia Preprocedure Evaluation (Signed)
Anesthesia Evaluation  Patient identified by MRN, date of birth, ID band Patient awake    Reviewed: Allergy & Precautions, H&P , NPO status , Patient's Chart, lab work & pertinent test results, reviewed documented beta blocker date and time   Airway Mallampati: II  TM Distance: >3 FB Neck ROM: full    Dental no notable dental hx. (+) Dental Advisory Given, Teeth Intact   Pulmonary neg pulmonary ROS   Pulmonary exam normal breath sounds clear to auscultation       Cardiovascular Exercise Tolerance: Good hypertension, Normal cardiovascular exam Rhythm:Regular Rate:Normal     Neuro/Psych   Anxiety     negative neurological ROS  negative psych ROS   GI/Hepatic negative GI ROS, Neg liver ROS,,,  Endo/Other    Class 4 obesityBMI 59. Class 5?  Renal/GU negative Renal ROS  negative genitourinary   Musculoskeletal   Abdominal  (+) + obese  Peds  Hematology negative hematology ROS (+)   Anesthesia Other Findings   Reproductive/Obstetrics negative OB ROS                             Anesthesia Physical Anesthesia Plan  ASA: 3  Anesthesia Plan: General   Post-op Pain Management:    Induction:   PONV Risk Score and Plan: Ondansetron, Dexamethasone and Midazolam  Airway Management Planned: LMA  Additional Equipment: None  Intra-op Plan:   Post-operative Plan: Extubation in OR  Informed Consent: I have reviewed the patients History and Physical, chart, labs and discussed the procedure including the risks, benefits and alternatives for the proposed anesthesia with the patient or authorized representative who has indicated his/her understanding and acceptance.     Dental Advisory Given  Plan Discussed with: CRNA  Anesthesia Plan Comments:         Anesthesia Quick Evaluation

## 2023-09-09 ENCOUNTER — Encounter (HOSPITAL_COMMUNITY): Payer: Self-pay | Admitting: Obstetrics & Gynecology

## 2023-09-09 ENCOUNTER — Ambulatory Visit (HOSPITAL_COMMUNITY): Payer: Self-pay | Admitting: Anesthesiology

## 2023-09-09 ENCOUNTER — Ambulatory Visit (HOSPITAL_COMMUNITY)
Admission: RE | Admit: 2023-09-09 | Discharge: 2023-09-09 | Disposition: A | Discharge status: Home / Self Care | Payer: BC Managed Care – PPO | Attending: Obstetrics & Gynecology | Admitting: Obstetrics & Gynecology

## 2023-09-09 ENCOUNTER — Encounter (HOSPITAL_COMMUNITY)
Admission: RE | Disposition: A | Discharge status: Home / Self Care | Payer: Self-pay | Source: Home / Self Care | Attending: Obstetrics & Gynecology

## 2023-09-09 ENCOUNTER — Ambulatory Visit (HOSPITAL_COMMUNITY): Payer: BC Managed Care – PPO | Admitting: Anesthesiology

## 2023-09-09 DIAGNOSIS — N920 Excessive and frequent menstruation with regular cycle: Secondary | ICD-10-CM | POA: Diagnosis present

## 2023-09-09 DIAGNOSIS — N946 Dysmenorrhea, unspecified: Secondary | ICD-10-CM | POA: Diagnosis not present

## 2023-09-09 DIAGNOSIS — Z6841 Body Mass Index (BMI) 40.0 and over, adult: Secondary | ICD-10-CM | POA: Insufficient documentation

## 2023-09-09 DIAGNOSIS — N939 Abnormal uterine and vaginal bleeding, unspecified: Secondary | ICD-10-CM | POA: Diagnosis not present

## 2023-09-09 DIAGNOSIS — F419 Anxiety disorder, unspecified: Secondary | ICD-10-CM | POA: Insufficient documentation

## 2023-09-09 DIAGNOSIS — N921 Excessive and frequent menstruation with irregular cycle: Secondary | ICD-10-CM | POA: Diagnosis not present

## 2023-09-09 DIAGNOSIS — I10 Essential (primary) hypertension: Secondary | ICD-10-CM | POA: Diagnosis not present

## 2023-09-09 DIAGNOSIS — E669 Obesity, unspecified: Secondary | ICD-10-CM | POA: Diagnosis not present

## 2023-09-09 HISTORY — PX: DILITATION & CURRETTAGE/HYSTROSCOPY WITH HYDROTHERMAL ABLATION: SHX5570

## 2023-09-09 SURGERY — DILATATION & CURETTAGE/HYSTEROSCOPY WITH HYDROTHERMAL ABLATION
Anesthesia: General | Site: Vagina

## 2023-09-09 MED ORDER — KETOROLAC TROMETHAMINE 30 MG/ML IJ SOLN: 30.0000 mg | INTRAMUSCULAR | Status: AC

## 2023-09-09 MED ORDER — MIDAZOLAM HCL 2 MG/2ML IJ SOLN
INTRAMUSCULAR | Status: DC | PRN
  Administered 2023-09-09 (×2): 1 mg via INTRAVENOUS

## 2023-09-09 MED ORDER — CHLORHEXIDINE GLUCONATE 0.12 % MT SOLN
15.0000 mL | Freq: Once | OROMUCOSAL | Status: AC
  Administered 2023-09-09: 15 mL via OROMUCOSAL

## 2023-09-09 MED ORDER — KETOROLAC TROMETHAMINE 30 MG/ML IJ SOLN
INTRAMUSCULAR | Status: AC
  Administered 2023-09-09: 30 mg via INTRAVENOUS
  Filled 2023-09-09: qty 1

## 2023-09-09 MED ORDER — LIDOCAINE-EPINEPHRINE 0.5 %-1:200000 IJ SOLN
INTRAMUSCULAR | Status: DC | PRN
  Administered 2023-09-09: 20 mL

## 2023-09-09 MED ORDER — DEXAMETHASONE SODIUM PHOSPHATE 10 MG/ML IJ SOLN
INTRAMUSCULAR | Status: DC | PRN
  Administered 2023-09-09: 10 mg via INTRAVENOUS

## 2023-09-09 MED ORDER — IBUPROFEN 600 MG PO TABS: 600.0000 mg | ORAL_TABLET | Freq: Four times a day (QID) | ORAL | 0 refills | Status: DC | PRN

## 2023-09-09 MED ORDER — DEXMEDETOMIDINE HCL IN NACL 80 MCG/20ML IV SOLN
INTRAVENOUS | Status: DC | PRN
  Administered 2023-09-09: 8 ug via INTRAVENOUS
  Administered 2023-09-09: 12 ug via INTRAVENOUS

## 2023-09-09 MED ORDER — OXYCODONE HCL 5 MG PO TABS
5.0000 mg | ORAL_TABLET | Freq: Four times a day (QID) | ORAL | 0 refills | Status: AC | PRN
Start: 2023-09-09 — End: 2023-09-12

## 2023-09-09 MED ORDER — ONDANSETRON 4 MG PO TBDP: 4.0000 mg | ORAL_TABLET | Freq: Three times a day (TID) | ORAL | 0 refills | Status: DC | PRN

## 2023-09-09 MED ORDER — ONDANSETRON HCL 4 MG/2ML IJ SOLN: 4.0000 mg | Freq: Once | INTRAMUSCULAR | Status: DC | PRN

## 2023-09-09 MED ORDER — FENTANYL CITRATE (PF) 100 MCG/2ML IJ SOLN
INTRAMUSCULAR | Status: AC
  Filled 2023-09-09: qty 2

## 2023-09-09 MED ORDER — GLYCOPYRROLATE PF 0.2 MG/ML IJ SOSY
PREFILLED_SYRINGE | INTRAMUSCULAR | Status: DC | PRN
  Administered 2023-09-09: .2 mg via INTRAVENOUS

## 2023-09-09 MED ORDER — ORAL CARE MOUTH RINSE: 15.0000 mL | Freq: Once | OROMUCOSAL | Status: AC

## 2023-09-09 MED ORDER — SODIUM CHLORIDE 0.9 % IR SOLN
Status: DC | PRN
  Administered 2023-09-09: 1000 mL

## 2023-09-09 MED ORDER — FENTANYL CITRATE (PF) 100 MCG/2ML IJ SOLN
INTRAMUSCULAR | Status: DC | PRN
  Administered 2023-09-09: 50 ug via INTRAVENOUS
  Administered 2023-09-09 (×2): 25 ug via INTRAVENOUS

## 2023-09-09 MED ORDER — LACTATED RINGERS IV SOLN: INTRAVENOUS | Status: DC | PRN

## 2023-09-09 MED ORDER — FENTANYL CITRATE PF 50 MCG/ML IJ SOSY: 25.0000 ug | PREFILLED_SYRINGE | INTRAMUSCULAR | Status: DC | PRN

## 2023-09-09 MED ORDER — PROPOFOL 500 MG/50ML IV EMUL
INTRAVENOUS | Status: DC | PRN
  Administered 2023-09-09: 100 mg via INTRAVENOUS
  Administered 2023-09-09: 50 mg via INTRAVENOUS
  Administered 2023-09-09: 100 mg via INTRAVENOUS
  Administered 2023-09-09 (×2): 50 mg via INTRAVENOUS
  Administered 2023-09-09: 200 ug/kg/min via INTRAVENOUS

## 2023-09-09 MED ORDER — OXYCODONE HCL 5 MG PO TABS: 5.0000 mg | ORAL_TABLET | Freq: Once | ORAL | Status: DC | PRN

## 2023-09-09 MED ORDER — ONDANSETRON HCL 4 MG/2ML IJ SOLN
INTRAMUSCULAR | Status: DC | PRN
  Administered 2023-09-09: 4 mg via INTRAVENOUS

## 2023-09-09 MED ORDER — LIDOCAINE HCL (CARDIAC) PF 100 MG/5ML IV SOSY
PREFILLED_SYRINGE | INTRAVENOUS | Status: DC | PRN
  Administered 2023-09-09: 40 mg via INTRATRACHEAL

## 2023-09-09 MED ORDER — OXYCODONE HCL 5 MG/5ML PO SOLN: 5.0000 mg | Freq: Once | ORAL | Status: DC | PRN

## 2023-09-09 MED ORDER — MIDAZOLAM HCL 2 MG/2ML IJ SOLN
INTRAMUSCULAR | Status: AC
Start: 2023-09-09 — End: ?
  Filled 2023-09-09: qty 2

## 2023-09-09 SURGICAL SUPPLY — 20 items
CLOTH BEACON ORANGE TIMEOUT ST (SAFETY) ×1 IMPLANT
COVER LIGHT HANDLE STERIS (MISCELLANEOUS) ×2 IMPLANT
DILATOR CANAL MILEX (MISCELLANEOUS) IMPLANT
GAUZE 4X4 16PLY ~~LOC~~+RFID DBL (SPONGE) ×1 IMPLANT
GLOVE BIO SURGEON STRL SZ 6.5 (GLOVE) ×1 IMPLANT
GLOVE BIOGEL PI IND STRL 7.0 (GLOVE) ×3 IMPLANT
GOWN STRL REUS W/ TWL LRG LVL3 (GOWN DISPOSABLE) ×1 IMPLANT
GOWN STRL REUS W/TWL LRG LVL3 (GOWN DISPOSABLE) ×2 IMPLANT
IV NS IRRIG 3000ML ARTHROMATIC (IV SOLUTION) ×1 IMPLANT
KIT TURNOVER CYSTO (KITS) ×1 IMPLANT
KIT TURNOVER KIT A (KITS) ×1 IMPLANT
NS IRRIG 1000ML POUR BTL (IV SOLUTION) ×1 IMPLANT
PACK PERI GYN (CUSTOM PROCEDURE TRAY) ×1 IMPLANT
PAD ARMBOARD 7.5X6 YLW CONV (MISCELLANEOUS) ×1 IMPLANT
PAD TELFA 3X4 1S STER (GAUZE/BANDAGES/DRESSINGS) ×1 IMPLANT
POSITIONER HEAD 8X9X4 ADT (SOFTGOODS) ×1 IMPLANT
SET BASIN LINEN APH (SET/KITS/TRAYS/PACK) ×1 IMPLANT
SET GENESYS HTA PROCERVA (MISCELLANEOUS) ×1 IMPLANT
SYR CONTROL 10ML LL (SYRINGE) ×1 IMPLANT
UNDERPAD 30X36 HEAVY ABSORB (UNDERPADS AND DIAPERS) ×1 IMPLANT

## 2023-09-09 NOTE — Transfer of Care (Signed)
Immediate Anesthesia Transfer of Care Note  Patient: Melanie Solis  Procedure(s) Performed: DILATATION & CURETTAGE/HYSTEROSCOPY WITH HYDROTHERMAL ABLATION (Vagina )  Patient Location: PACU  Anesthesia Type:General  Level of Consciousness: drowsy  Airway & Oxygen Therapy: Patient Spontanous Breathing and Patient connected to face mask oxygen  Post-op Assessment: Report given to RN and Post -op Vital signs reviewed and stable  Post vital signs: Reviewed and stable  Last Vitals:  Vitals Value Taken Time  BP 114/59 09/09/23 0919  Temp    Pulse 84 09/09/23 0922  Resp 14 09/09/23 0922  SpO2 97 % 09/09/23 0922  Vitals shown include unfiled device data.  Last Pain:  Vitals:   09/09/23 0746  TempSrc: Oral  PainSc: 0-No pain         Complications: No notable events documented.

## 2023-09-09 NOTE — Op Note (Signed)
Operative Report  PreOp: 1) Heavy menstrual bleeding PostOp: same Procedure:  Hysteroscopy, Dilation and Curettage, Endometrial ablation Surgeon: Dr. Myna Hidalgo Anesthesia: General Complications:none EBL: Minimal IVF:800cc   Findings: 6cm uterus with proliferative endometrium.  Both ostia visualized.  Specimens: endometrial curettings  Procedure: The patient was taken to the operating room where she underwent general anesthesia without difficulty. The patient was placed in a low lithotomy position using Allen stirrups. She was then prepped and draped in the normal sterile fashion. Sterile speculum was placed.  A single tooth tenaculum was placed on the anterior lip of the cervix. Cervical block was completed using 0.5% lidocaine with epinephrine-20cc  The uterus was then sounded to 6cm.  Dilation was not indicated. The hysteroscope was inserted and findings were visualized as noted above.  The hysteroscope was removed and sharp curettage was performed. The tissue was sent to pathology.   The hydrothermal ablation hysteroscopic apparatus was then reinserted.  Cavity assessment was performed and passed with minimal leakage of fluid.  The hydrothermal ablation was then carried out as per protocol.   Complete ablation of the endometrium was observed and the hysteroscope was removed under direct visualization.  All instrument were then removed. Hemostasis was observed at the cervical site. The patient was repositioned to the supine position. The patient tolerated the procedure without any complications and taken to recovery in stable condition.   Myna Hidalgo, DO Attending Obstetrician & Gynecologist, [facility] for [employer], [facility]

## 2023-09-09 NOTE — Anesthesia Postprocedure Evaluation (Signed)
Anesthesia Post Note  Patient: Janeshia M Schadt  Procedure(s) Performed: DILATATION & CURETTAGE/HYSTEROSCOPY WITH HYDROTHERMAL ABLATION (Vagina )  Patient location during evaluation: PACU Anesthesia Type: General Level of consciousness: awake and alert Pain management: pain level controlled Vital Signs Assessment: post-procedure vital signs reviewed and stable Respiratory status: spontaneous breathing, nonlabored ventilation, respiratory function stable and patient connected to nasal cannula oxygen Cardiovascular status: blood pressure returned to baseline and stable Postop Assessment: no apparent nausea or vomiting Anesthetic complications: no  There were no known notable events for this encounter.   Last Vitals:  Vitals:   09/09/23 0945 09/09/23 0954  BP: (!) 120/58 (!) 147/86  Pulse: 72 65  Resp: 15 14  Temp:  (!) 36.4 C  SpO2: 98% 99%    Last Pain:  Vitals:   09/09/23 0954  TempSrc: Oral  PainSc: 0-No pain                 Jiovany Scheffel L Yuleimy Kretz

## 2023-09-09 NOTE — Discharge Instructions (Signed)
HOME INSTRUCTIONS  Please note any unusual or excessive bleeding, pain, swelling. Mild dizziness or drowsiness are normal for about 24 hours after surgery.   Shower when comfortable  Restrictions: No driving for 24 hours or while taking pain medications.  Activity:  No heavy lifting (> 10 lbs), nothing in vagina (no tampons, douching, or intercourse) x 4 weeks; no tub baths for 4 weeks Vaginal spotting is expected but if your bleeding is heavy, period like,  please call the office   Diet:  You may return to your regular diet.  Do not eat large meals.  Eat small frequent meals throughout the day.  Continue to drink a good amount of water at least 6-8 glasses of water per day, hydration is very important for the healing process.  Pain Management: Take over the counter tylenol or ibuprofen as needed for pain.  You can either take one or alternate between the two medications for pain management.  You may also use a heating pack as needed.   -A prescription of the ibuprofen has been sent in if needed. -For breakthrough or severe pain a prescription of oxycodone has been sent in.  This medication will cause constipation.  If you take this medication, be sure to take a stool softener twice daily while taking this medication and drink plenty of water.  Alcohol -- Avoid for 24 hours and while taking pain medications.  Nausea: Take sips of ginger ale or soda.  A prescription of zofran (ondansetron) has also been sent in should you have significant nausea/vomiting.  Fever -- Call physician if temperature over 101 degrees  Follow up:  If you do not already have a follow up appointment scheduled, please call the office at [phone].  If you experience fever (a temperature greater than 100.4), pain unrelieved by pain medication, shortness of breath, swelling of a single leg, or any other symptoms which are concerning to you please the office immediately.

## 2023-09-10 LAB — SURGICAL PATHOLOGY

## 2023-09-14 ENCOUNTER — Encounter: Payer: Self-pay | Admitting: Obstetrics & Gynecology

## 2023-09-16 ENCOUNTER — Encounter (HOSPITAL_COMMUNITY): Payer: Self-pay | Admitting: Obstetrics & Gynecology

## 2023-11-19 ENCOUNTER — Other Ambulatory Visit: Payer: Self-pay | Admitting: Obstetrics & Gynecology

## 2023-11-19 DIAGNOSIS — F419 Anxiety disorder, unspecified: Secondary | ICD-10-CM

## 2023-11-19 MED ORDER — ESCITALOPRAM OXALATE 20 MG PO TABS: 20.0000 mg | ORAL_TABLET | Freq: Every evening | ORAL | 4 refills | Status: DC

## 2023-11-19 NOTE — Progress Notes (Signed)
Lexapro sen tin  Myna Hidalgo, DO Attending Obstetrician & Gynecologist, [facility] for [employer], [facility]

## 2024-03-23 ENCOUNTER — Encounter: Payer: Self-pay | Admitting: Obstetrics & Gynecology

## 2024-03-24 ENCOUNTER — Other Ambulatory Visit: Payer: Self-pay | Admitting: Obstetrics & Gynecology

## 2024-03-24 MED ORDER — ONDANSETRON 4 MG PO TBDP: 4.0000 mg | ORAL_TABLET | Freq: Three times a day (TID) | ORAL | 0 refills | Status: AC | PRN

## 2024-03-24 NOTE — Progress Notes (Signed)
 Refill zofran   Tynisha Ogan, DO Attending Obstetrician & Gynecologist, [facility] for [employer], [facility]

## 2024-09-13 ENCOUNTER — Ambulatory Visit
Admission: EM | Admit: 2024-09-13 | Discharge: 2024-09-13 | Disposition: A | Discharge status: Home / Self Care | Payer: Self-pay | Attending: Urgent Care | Admitting: Urgent Care

## 2024-09-13 DIAGNOSIS — R0789 Other chest pain: Secondary | ICD-10-CM

## 2024-09-13 MED ORDER — IBUPROFEN 600 MG PO TABS: 600.0000 mg | ORAL_TABLET | Freq: Four times a day (QID) | ORAL | 0 refills | Status: AC | PRN

## 2024-09-13 MED ORDER — CYCLOBENZAPRINE HCL 5 MG PO TABS: 5.0000 mg | ORAL_TABLET | Freq: Every evening | ORAL | 0 refills | Status: AC | PRN

## 2024-09-13 NOTE — ED Provider Notes (Signed)
 [facility]  Note:  This document was prepared using Conservation officer, historic buildings and may include unintentional dictation errors.  MRN: [id number] DOB: 02/17/1996  Subjective:   Melanie Solis is a 28 y.o. female presenting for 3 day history of intermittent mid-left sided chest pain. Symptoms are soreness, burning type sensation. Has been taking Pepcid with minimal relief.  No fever, cough, shortness of breath, wheezing, hemoptysis, fall, trauma, heavy lifting, strenuous physical exercise, heart racing or palpitations, history of cardiac arrhythmias.  No drug use.  No asthma.  No smoking.  No current facility-administered medications for this encounter.  Current Outpatient Medications:    escitalopram  (LEXAPRO ) 10 MG tablet, Take 10 mg by mouth daily., Disp: , Rfl:    amLODipine  (NORVASC ) 5 MG tablet, Take 1 tablet (5 mg total) by mouth daily., Disp: 30 tablet, Rfl: 3   Bioflavonoid Products (VITAMIN C) CHEW, Chew 1 tablet by mouth in the morning. Gummy, Disp: , Rfl:    ELDERBERRY PO, Take 1 tablet by mouth in the morning. Gummy, Disp: , Rfl:    escitalopram  (LEXAPRO ) 20 MG tablet, Take 1 tablet (20 mg total) by mouth at bedtime., Disp: 90 tablet, Rfl: 4   ibuprofen  (ADVIL ) 600 MG tablet, Take 1 tablet (600 mg total) by mouth every 6 (six) hours as needed., Disp: 30 tablet, Rfl: 0   ondansetron  (ZOFRAN -ODT) 4 MG disintegrating tablet, Take 1 tablet (4 mg total) by mouth every 8 (eight) hours as needed., Disp: 20 tablet, Rfl: 0   No Known Allergies  Past Medical History:  Diagnosis Date   Anxiety    Dysmenorrhea 12/06/2013   Hypertension    Menorrhagia 12/06/2013   Menstrual extraction 12/06/2013   Will try OCs   Obesity    Vaginal Pap smear, abnormal      Past Surgical History:  Procedure Laterality Date   DILITATION & CURRETTAGE/HYSTROSCOPY WITH HYDROTHERMAL ABLATION N/A 09/09/2023   Procedure: DILATATION & CURETTAGE/HYSTEROSCOPY WITH  HYDROTHERMAL ABLATION;  Surgeon: Ozan, Jennifer, DO;  Location: [facility];  Service: Gynecology;  Laterality: N/A;   WISDOM TOOTH EXTRACTION      Family History  Problem Relation Age of Onset   Cancer Mother        skin    Social History   Tobacco Use   Smoking status: Never   Smokeless tobacco: Never  Vaping Use   Vaping status: Never Used  Substance Use Topics   Alcohol use: Yes    Comment: occ   Drug use: No    ROS   Objective:   Vitals: BP (!) 141/83 (BP Location: Right Arm)   Pulse 71   Temp 97.7 F (36.5 C) (Oral)   Resp 16   SpO2 96%   Physical Exam Constitutional:      General: She is not in acute distress.    Appearance: Normal appearance. She is well-developed. She is not ill-appearing, toxic-appearing or diaphoretic.  HENT:     Head: Normocephalic and atraumatic.     Nose: Nose normal.     Mouth/Throat:     Mouth: Mucous membranes are moist.  Eyes:     General: No scleral icterus.       Right eye: No discharge.        Left eye: No discharge.     Extraocular Movements: Extraocular movements intact.  Cardiovascular:     Rate and Rhythm: Normal rate and regular rhythm.     Heart sounds: Normal heart sounds. No murmur  heard.    No friction rub. No gallop.  Pulmonary:     Effort: Pulmonary effort is normal. No respiratory distress.     Breath sounds: No stridor. No wheezing, rhonchi or rales.  Chest:     Chest wall: Tenderness (reproducible over area outlined) present.    Skin:    General: Skin is warm and dry.  Neurological:     General: No focal deficit present.     Mental Status: She is alert and oriented to person, place, and time.  Psychiatric:        Mood and Affect: Mood normal.        Behavior: Behavior normal.    ED ECG REPORT   Date: 09/13/2024  EKG Time: 12:30 PM  Rate: 68bpm  Rhythm: normal sinus rhythm  Axis: normal  Intervals:none  ST&T Change: T-wave flattening in lead III  Narrative Interpretation: Sinus rhythm at 68  bpm with nonspecific T wave change in the single lead as above.  EKG is improved from prior a year ago.   Assessment and Plan :   PDMP not reviewed this encounter.  1. Chest wall pain   2. Atypical chest pain    Recommend conservative management.  Patient has a low heart score, low PERC score.  Will defer ER visit.  Recommended patient follow-up with her PCP for consideration of anxiety as a source of her symptoms.  For now, can use ibuprofen , cyclobenzaprine, management of chest wall pain.  Continue with Pepcid as needed.  Counseled patient on potential for adverse effects with medications prescribed/recommended today, ER and return-to-clinic precautions discussed, patient verbalized understanding.    Christopher Savannah, PA-C 09/13/24 1231

## 2024-09-13 NOTE — ED Triage Notes (Addendum)
 Pt c/o intermittent mid/left side CP x 3 days-describes as sore/burning-taking pepcid with no relief-NAD-steady gait

## 2024-11-17 ENCOUNTER — Other Ambulatory Visit: Payer: Self-pay | Admitting: *Deleted

## 2024-11-17 DIAGNOSIS — F419 Anxiety disorder, unspecified: Secondary | ICD-10-CM

## 2024-11-17 MED ORDER — ESCITALOPRAM OXALATE 20 MG PO TABS: 20.0000 mg | ORAL_TABLET | Freq: Every evening | ORAL | 4 refills | Status: AC
